# Patient Record
Sex: Female | Born: 1960 | Race: White | Hispanic: No | Marital: Married | Smoking: Never smoker
Health system: Southern US, Community
[De-identification: ages and names within clinical notes are randomized; demographics above are authoritative.]

## PROBLEM LIST (undated history)

## (undated) DIAGNOSIS — I10 Essential (primary) hypertension: Secondary | ICD-10-CM

## (undated) DIAGNOSIS — F32A Depression, unspecified: Secondary | ICD-10-CM

## (undated) DIAGNOSIS — I639 Cerebral infarction, unspecified: Secondary | ICD-10-CM

## (undated) DIAGNOSIS — M1990 Unspecified osteoarthritis, unspecified site: Secondary | ICD-10-CM

## (undated) DIAGNOSIS — Z87898 Personal history of other specified conditions: Secondary | ICD-10-CM

## (undated) DIAGNOSIS — F419 Anxiety disorder, unspecified: Secondary | ICD-10-CM

## (undated) DIAGNOSIS — E785 Hyperlipidemia, unspecified: Secondary | ICD-10-CM

## (undated) DIAGNOSIS — C801 Malignant (primary) neoplasm, unspecified: Secondary | ICD-10-CM

## (undated) DIAGNOSIS — R531 Weakness: Secondary | ICD-10-CM

## (undated) HISTORY — DX: Hyperlipidemia, unspecified: E78.5

## (undated) HISTORY — DX: Personal history of other specified conditions: Z87.898

## (undated) HISTORY — DX: Depression, unspecified: F32.A

## (undated) HISTORY — DX: Essential (primary) hypertension: I10

## (undated) HISTORY — DX: Cerebral infarction, unspecified: I63.9

## (undated) HISTORY — DX: Anxiety disorder, unspecified: F41.9

## (undated) HISTORY — PX: NO PAST SURGERIES: SHX2092

---

## 2007-01-12 ENCOUNTER — Ambulatory Visit: Payer: Self-pay | Admitting: Cardiology

## 2007-01-15 ENCOUNTER — Inpatient Hospital Stay (HOSPITAL_COMMUNITY)
Admission: RE | Admit: 2007-01-15 | Discharge: 2007-02-05 | Payer: Self-pay | Admitting: Physical Medicine & Rehabilitation

## 2007-01-15 ENCOUNTER — Ambulatory Visit: Payer: Self-pay | Admitting: Physical Medicine & Rehabilitation

## 2010-05-21 ENCOUNTER — Encounter: Payer: Self-pay | Admitting: Emergency Medicine

## 2010-06-04 ENCOUNTER — Ambulatory Visit (HOSPITAL_COMMUNITY): Admission: RE | Admit: 2010-06-04 | Discharge: 2010-06-04 | Payer: Self-pay | Admitting: Urology

## 2010-06-25 ENCOUNTER — Ambulatory Visit (HOSPITAL_COMMUNITY): Admission: RE | Admit: 2010-06-25 | Discharge: 2010-06-25 | Payer: Self-pay | Admitting: Urology

## 2010-10-25 ENCOUNTER — Inpatient Hospital Stay (HOSPITAL_COMMUNITY): Admission: EM | Admit: 2010-10-25 | Discharge: 2010-05-22 | Payer: Self-pay | Admitting: Urology

## 2011-02-03 LAB — COMPREHENSIVE METABOLIC PANEL
ALT: 22 U/L (ref 0–35)
Albumin: 3.5 g/dL (ref 3.5–5.2)
BUN: 14 mg/dL (ref 6–23)
Calcium: 8.8 mg/dL (ref 8.4–10.5)
Glucose, Bld: 141 mg/dL — ABNORMAL HIGH (ref 70–99)
Potassium: 3.4 mEq/L — ABNORMAL LOW (ref 3.5–5.1)
Sodium: 133 mEq/L — ABNORMAL LOW (ref 135–145)
Total Protein: 7.7 g/dL (ref 6.0–8.3)

## 2011-02-03 LAB — CBC
Hemoglobin: 12.7 g/dL (ref 12.0–15.0)
MCV: 84 fL (ref 78.0–100.0)
Platelets: 286 10*3/uL (ref 150–400)

## 2011-02-03 LAB — URINALYSIS, ROUTINE W REFLEX MICROSCOPIC
Glucose, UA: NEGATIVE mg/dL
Ketones, ur: 15 mg/dL — AB
Nitrite: NEGATIVE
Specific Gravity, Urine: 1.021 (ref 1.005–1.030)
Urobilinogen, UA: 2 mg/dL — ABNORMAL HIGH (ref 0.0–1.0)

## 2011-02-03 LAB — URINE CULTURE: Colony Count: 35000

## 2011-02-03 LAB — DIFFERENTIAL
Lymphs Abs: 1.1 10*3/uL (ref 0.7–4.0)
Monocytes Absolute: 0.6 10*3/uL (ref 0.1–1.0)
Monocytes Relative: 4 % (ref 3–12)
Neutro Abs: 13.3 10*3/uL — ABNORMAL HIGH (ref 1.7–7.7)
Neutrophils Relative %: 89 % — ABNORMAL HIGH (ref 43–77)

## 2011-02-03 LAB — URINE MICROSCOPIC-ADD ON

## 2011-02-13 IMAGING — CR DG ABDOMEN 1V
2 series · 2 of 2 positions shown · non-contrast
Comparison: 06/04/2010

CLINICAL DATA: Left UPJ stone

ABDOMEN - 1 VIEW

[t abdomen supine (1 of 2)]
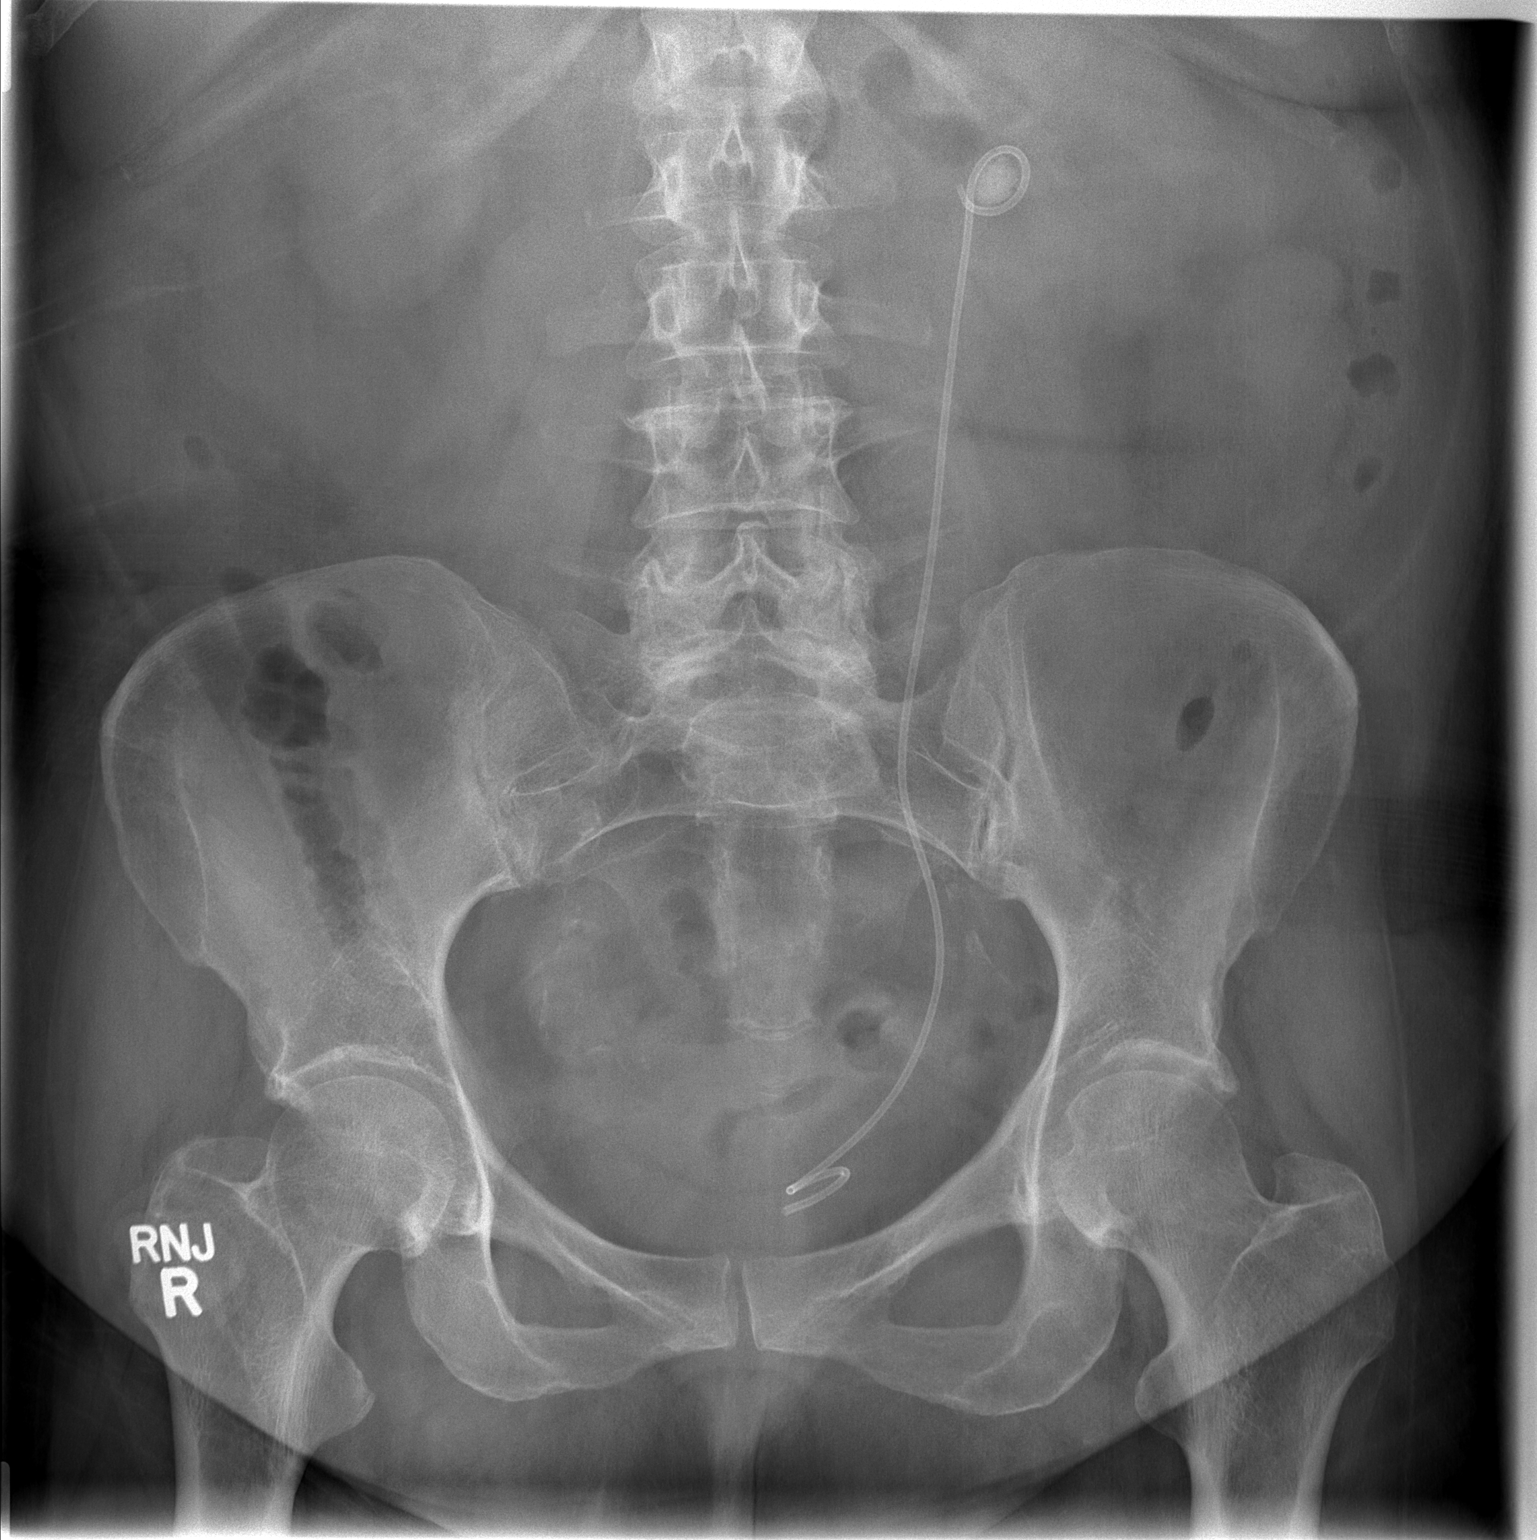

[t abdomen supine (2 of 2)]
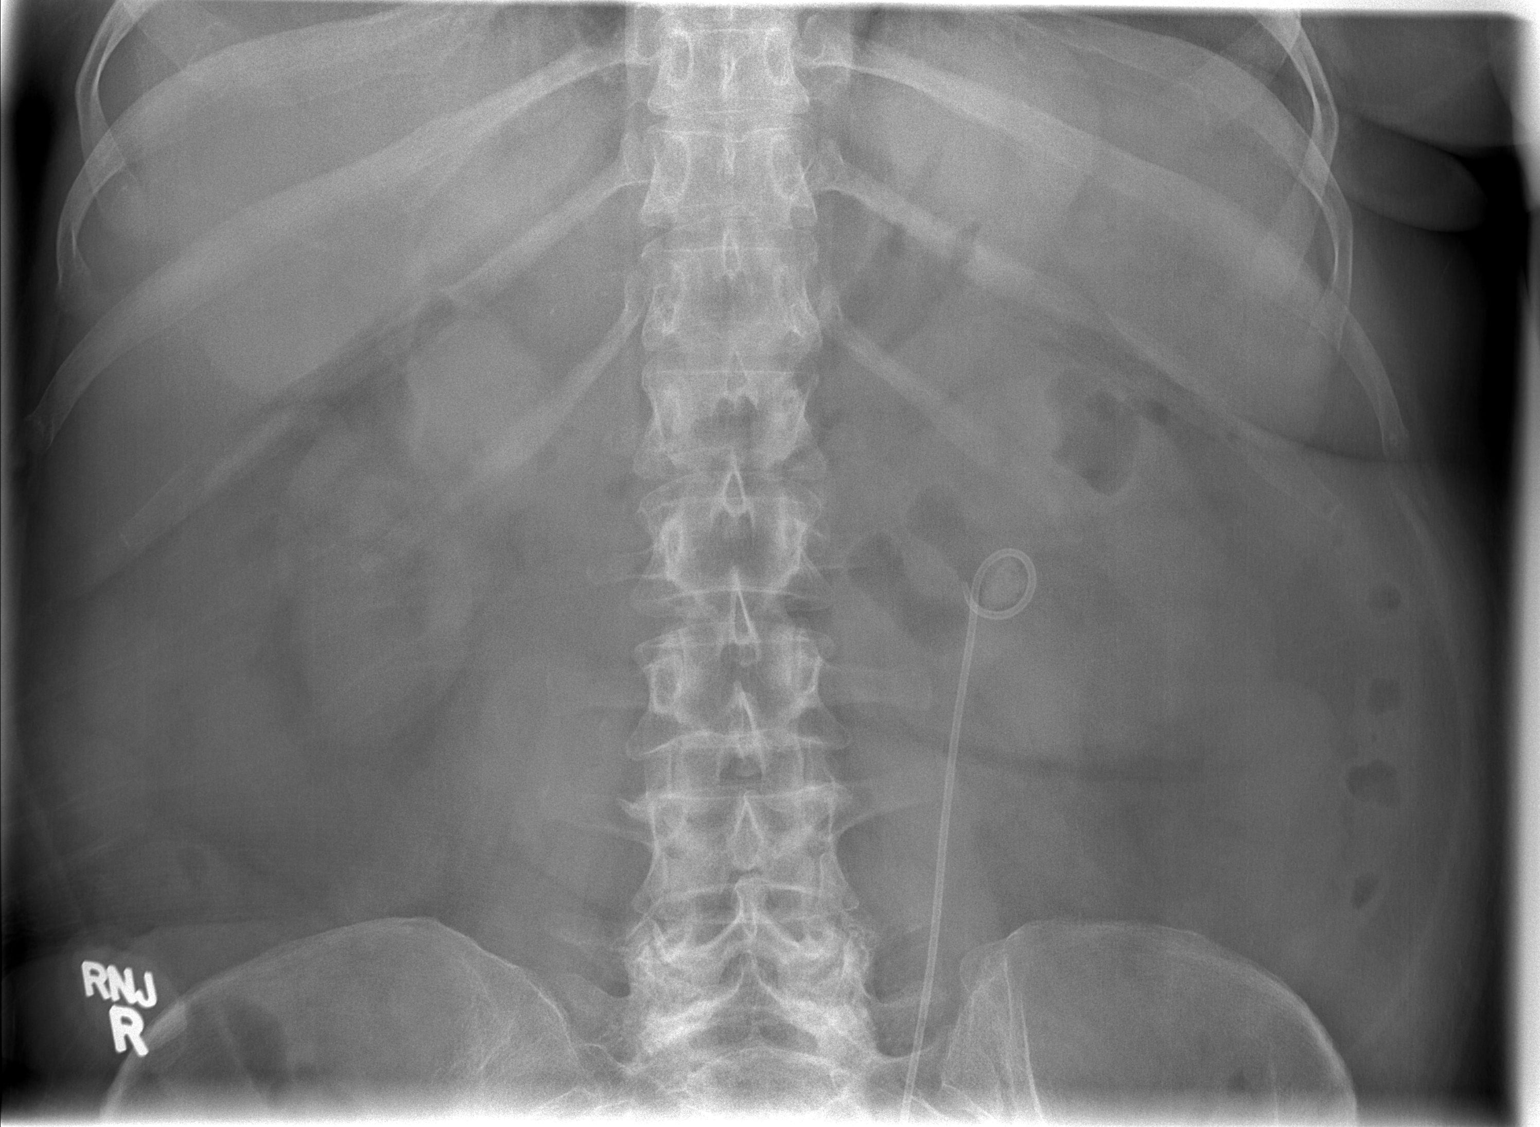

[2 of 2 positions shown; findings below may reference images not displayed]

FINDINGS: A left-sided double J ureteral stent remains in place.
There is a 1.3 cm left renal stone again noted.  No new stones are
seen.  The visualized portion the abdomen is otherwise
unremarkable.
IMPRESSION: 1.3 cm left UPJ stone with a stent remaining in place.

## 2011-04-01 ENCOUNTER — Ambulatory Visit (HOSPITAL_BASED_OUTPATIENT_CLINIC_OR_DEPARTMENT_OTHER)
Admission: RE | Admit: 2011-04-01 | Discharge: 2011-04-01 | Disposition: A | Discharge status: Home / Self Care | Payer: Medicare Other | Source: Ambulatory Visit | Attending: Urology | Admitting: Urology

## 2011-04-01 DIAGNOSIS — Z01812 Encounter for preprocedural laboratory examination: Secondary | ICD-10-CM | POA: Insufficient documentation

## 2011-04-01 DIAGNOSIS — R29898 Other symptoms and signs involving the musculoskeletal system: Secondary | ICD-10-CM | POA: Insufficient documentation

## 2011-04-01 DIAGNOSIS — I10 Essential (primary) hypertension: Secondary | ICD-10-CM | POA: Insufficient documentation

## 2011-04-01 DIAGNOSIS — I69998 Other sequelae following unspecified cerebrovascular disease: Secondary | ICD-10-CM | POA: Insufficient documentation

## 2011-04-01 DIAGNOSIS — N210 Calculus in bladder: Secondary | ICD-10-CM | POA: Insufficient documentation

## 2011-04-01 DIAGNOSIS — Z01810 Encounter for preprocedural cardiovascular examination: Secondary | ICD-10-CM | POA: Insufficient documentation

## 2011-04-01 DIAGNOSIS — Z466 Encounter for fitting and adjustment of urinary device: Secondary | ICD-10-CM | POA: Insufficient documentation

## 2011-04-01 LAB — POCT I-STAT 4, (NA,K, GLUC, HGB,HCT)
Glucose, Bld: 84 mg/dL (ref 70–99)
Hemoglobin: 13.6 g/dL (ref 12.0–15.0)
Potassium: 3.8 mEq/L (ref 3.5–5.1)

## 2011-04-02 NOTE — Op Note (Signed)
  Denise Harris, Denise Harris                ACCOUNT NO.:  [id number]  MEDICAL RECORD NO.:  [id number]          PATIENT TYPE:  LOCATION:                                 FACILITY:  PHYSICIAN:  Valetta Fuller, M.D.  DATE OF BIRTH:  February 05, 1961  DATE OF PROCEDURE: DATE OF DISCHARGE:                              OPERATIVE REPORT   PREOPERATIVE DIAGNOSES: 1. Retained double-J stent. 2. Large bladder calculus adherent to distal end of ureteral stent.  POSTOPERATIVE DIAGNOSES: 1. Retained double-J stent. 2. Large bladder calculus adherent to distal end of ureteral stent.  PROCEDURE PERFORMED:  Cystoscopy with electrohydraulic lithotripsy fragmentation of large bladder calculus and removal of double-J stent.  SURGEON:  Valetta Fuller, M.D.  ANESTHESIA:  General.  INDICATIONS:  [name] is 50 years of age.  The patient underwent treatment for a stone in the summer of 2011.  She had a 14-mm stone in her left renal pelvis.  She was treated with a double-J stent placement and then lithotripsy.  The patient had several followup appointments scheduled, which she did not show up for.  She had been recontacted and rescheduled on several occasions.  The patient subsequently was encouraged to get in.  A KUB showed the stent to be in position with a large calcification in the bladder end of her double-J stent. Cystoscopy confirmed a large adherent stone to the distal end of her double-J stent, but there were no obvious apparent calcifications on the more proximal aspect of the stent.  She presents now for EHL of the large bladder stone adherent to the stent along with an attempt at removal of her double-J stent if feasible.  TECHNIQUE AND FINDINGS:  The patient was brought to the operating room. She received perioperative gentamicin.  PAS compression boots were placed.  She had successful induction of general anesthesia and was then placed in the lithotomy position and prepped and draped in the  usual manner.  An appropriate surgical time-out was performed.  Cystoscopy revealed a large 3-cm to 4-cm stone which had formed around the distal aspect of her left double-J stent.  The bladder itself showed some diffuse erythema and inflammatory changes from the stent and stone. Utilizing saline as an irrigant, the EHL probe was utilized on the stone and we were able to knock all of the stone pieces off the double-J stent.  Larger pieces were then fractured with the EHL probe until they were all less than 5-mm to 6-mm in size.  Utilizing the grasper along with some fluoroscopic guidance, the double-J stent was able to be removed without any difficulty and no pressure or force was required to extract the stent.  Irrigation was utilized in the bladder to remove all the stone pieces.  The patient had no obvious complications or problems and was brought to the recovery room in stable condition.     Valetta Fuller, M.D.     DSG/MEDQ  D:  04/01/2011  T:  04/01/2011  Job:  [id number]  Electronically Signed by Barron Alvine M.D. on 04/02/2011 07:02:21 PM

## 2011-04-05 NOTE — H&P (Signed)
NAMECHARELLE, PETRAKIS NO.:  [id number]   MEDICAL RECORD NO.:  [id number]          PATIENT TYPE:  IPS   LOCATION:  4025                         FACILITY:  [facility]   PHYSICIAN:  Ellwood Dense, M.D.   DATE OF BIRTH:  Jul 02, 1961   DATE OF ADMISSION:  01/15/2007  DATE OF DISCHARGE:                              HISTORY & PHYSICAL   PRIMARY CARE PHYSICIAN:  None.   HISTORY OF PRESENT ILLNESS:  Ms. Wynder is a 50 year old adult female  with a fairly insignificant past medical history apart from pregnancy-  induced hypertension and gestational diabetes.  She had received no  medical attention recently.  She was admitted to [facility]  January 09, 2007 with progressive left-sided weakness through the day.  MRI study of the brain showed an acute right frontal infarct with  extensive chronic ischemic microvascular disease along with scattered  old white matter infarcts.  Carotid Doppler showed no internal carotid  artery stenosis.  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  Echocardiogram showed an ejection fraction of 50% with left ventricular  hypertrophy, mild pulmonary hypertension, and mild mitral regurgitation  and aortic insufficiency.  She was also noted to have elevated  cholesterol levels and was started on Lipitor.  She has been placed on  aspirin for stroke prophylaxis.  Coagulopathy studies are pending at  this time.   The patient was evaluated by the rehabilitation physicians and felt to  be an appropriate candidate for inpatient rehabilitation.   REVIEW OF SYSTEMS:  Positive for incontinence of bowel and bladder since  the above noted stroke.  Occasional diarrhea.  Abdominal pain.  Left  should pain.   PAST MEDICAL HISTORY:  1. Hypertension (during pregnancy).  2. History of gestational  diabetes.  3. Obesity.   FAMILY HISTORY:  Positive for coronary artery disease and breast cancer.   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  Her husband works during the day and  she has two children ages 2 and 7 who go to school.  The patient does  not use alcohol or tobacco.  She has received no medical attention for  several years.   FUNCTIONAL HISTORY PRIOR TO ADMISSION:  Independent and working in the  home.   ALLERGIES:  NO KNOWN DRUG ALLERGIES.   MEDICATIONS PRIOR TO ADMISSION:  None.   LABORATORIES:  Recent hemoglobin was 13.8, hematocrit of 39.6, and  platelet count of 322,000 with a white count of 10.3.  Recent sodium was  136, potassium 3.9, chloride 101, CO2 28, BUN 15 and creatinine 0.7 with  glucose of 106.  Recent cholesterol total was 204 with HDL cholesterol  of 26 and LDL cholesterol of 155 and triglycerides of 115.   PHYSICAL EXAMINATION:  GENERAL:  Reasonably well-appearing middle-aged  adult female lying in bed with obvious left-sided paresis.  Vitals  were  not yet obtained.  HEENT:  Normocephalic, nontraumatic.  CARDIOVASCULAR:  Regular rate and rhythm, S1 and S2 without murmurs.  ABDOMEN:  Soft, obese, nontender with positive bowel sounds.  LUNGS:  Clear to auscultation bilaterally.  EXTREMITIES:  Right upper and right lower extremity exam showed 5-/5  strength throughout.  Bulk and tone were normal and reflexes were 2+ and  symmetrical.  Sensation was intact to light touch throughout the right  upper and right lower extremity.  Left upper extremity exam showed 0-1/5  strength.  Sensation was decreased to light touch in the left upper  extremity.  Left lower extremity exam showed 0/5 strength with decreased  sensation.  Bulk was normal and tone was decreased in the left side.  Reflexes were 0 in the left upper and left lower extremity.   IMPRESSION:  1. 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  2. Hypertension, noncontrolled.  3. Dyslipidemia.   Presently the patient has deficits in ADLs, transfers and ambulation  secondary to the above noted right frontal infarct with left  hemiparesis.   PLAN:  1. Admit to the rehabilitation unit for daily physical therapy, for      range of motion, strengthening, bed mobility, transfers, pregait      training, gait training and equipment eval.  2. Occupational therapy for range of motion, strengthening, ADLs,      cognitive/perceptual training, splinting and equipment eval.  3. Speech therapy to assess higher level communication.  4. Case management to assess home environment, assist with discharge      planning and arrange for appropriate followup care with new primary      care physician.  5. Social worker to assess family and social support, counsel patient      regarding disability issues and assist in discharge planning      including setting up primary care.  6. Check admission labs including CBC and CMET Friday, January 16, 2007.  7. Monitor hypertension on Catapres 0.1 mg p.o. b.i.d., lisinopril 10      mg daily, Norvasc 10 mg daily and hydrochlorothiazide 12.5 mg p.o.      daily.  8. Check hemoglobin A1c level in the morning.  9. Continue enteric-coated aspirin 325 mg p.o. daily.  10.Continue Lipitor 10 mg p.o. q.p.m. for dyslipidemia.  11.Check postvoiding residuals and in-and-out cath for no void or      volumes greater than 300 mL.  12.__________ to the left lower extremity and splint left upper      extremity as necessary.  13.Lovenox subcutaneous 40 mg daily for DVT prophylaxis.  14.Keep the left upper extremity elevated on pillow.   PROGNOSIS:  Fair.   ESTIMATED LENGTH OF STAY:  15-25 days.   GOALS:  Standby assist to occasional min assist for ADLs, transfers and  short distance ambulation.           ______________________________  Ellwood Dense, M.D.    DC/MEDQ  D:   01/15/2007  T:  01/15/2007  Job:  [id number]

## 2011-04-05 NOTE — Discharge Summary (Signed)
[name], [name]                ACCOUNT NO.:  [id number]   MEDICAL RECORD NO.:  [id number]          PATIENT TYPE:  IPS   LOCATION:  4025                         FACILITY:  [facility]   PHYSICIAN:  Ranelle Oyster, M.D.DATE OF BIRTH:  Mar 30, 1961   DATE OF ADMISSION:  01/15/2007  DATE OF DISCHARGE:  02/05/2007                               DISCHARGE SUMMARY   DISCHARGE DIAGNOSIS:  1. Acute right frontal infarct.  2. Hypertension.  3. Urinary retention, resolved.  4. Left biceps tendonitis.   HISTORY OF PRESENT ILLNESS:  Denise Harris is a 50 year old female with a  history of pregnancy-induced hypertension, otherwise in good health,  admitted to [facility] on January 09, 2007, with progressive  left-sided weakness through the day.  MRI of brain showed acute right  frontal infarct, extensive chronic ischemic microvascular changes, and  scattered old white matter infarcts.  Carotid Dopplers done showed no  ICA stenosis.  MRA of brain showed moderate stenosis distal MI, left MCA  artery, and moderate stenosis of distal MI of the right MCA.  A cardiac  echo done showed EF of 50%, left ventricular hypertrophy, mild pulmonary  hypertension, mild MR and AI.  The patient also noted to have  dyslipidemia with cholesterol at 204, LDL 155, and HDL of 26, and  started on Lipitor.  Currently on aspirin for CVA prophylaxis.  Coagulopathy done, the report is unavailable.  Rehab consulted for  progressive therapy.   PAST MEDICAL HISTORY:  Significant for:  1. Hypertension during pregnancy.  2. History of gestational diabetes.  3. Obesity.   ALLERGIES:  NO KNOWN DRUG ALLERGIES.   FAMILY HISTORY:  Positive for coronary artery disease and breast cancer.   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  Does not use any tobacco or alcohol, has  2 children at home ages 41 and 28.   HOSPITAL COURSE:  Ms. Denise Harris was admitted to  rehab on January 15, 2007, for inpatient therapies to consist of PT, OT daily.  At time of  admission the patient was noted to have soft speech, able to follow two-  step commands, noted to have dense left hemiparesis with left upper  extremity being flaccid.  The patient reported problems with  incontinence of bladder, new since CVA.  The patient was maintained on  aspirin for CVA prophylaxis throughout her stay.  Bladder scanning was  done secondary to complaints of incontinence and the patient was noted  to have bladder volumes up to 400 mL.  She was started on in-and-out  cath schedule if no void in 8 hours or for high bladder volumes.  A UA/UC was sent off, and showed no growth.  Check of CBC revealed  hemoglobin 13.3, hematocrit 39.1, white count 10.4, platelets 311.  Check of lytes revealed sodium 137, potassium 4.2, chloride 102, CO2 26,  BUN 21, creatinine 0.81, glucose 92.  LFTs revealed AST at 21, ALT 19,  alkaline phos 90, albumin 3.5, total protein 6.9, total bili 0.9.  Hemoglobin A1c  was normal at 5.2.  The patient's blood pressures were monitored on b.i.d. basis and were  noted to be from the low 100s to 130s systolic, 60s to 16X diastolic.  Heart rate stable.  As the patient with problems voiding, she was started on Urecholine at  25 mg n.p.o. q.i.d.  As voiding improved, this was tapered off by time  of discharge.  The patient was noted to have a affect with some issues  with maintaining attention to task.  The patient denied depression.  She  was started on Reglan for activation, motivation and this was increased  to 15 mg b.i.d.  This will be tapered off in the next 4 weeks past  discharge.  The patient with complaints of left shoulder pain and  Neurontin was added to help with neuropathic symptoms.  She continued to  have an increasing left shoulder pain.  X-rays of left shoulder were  negative for fracture/dislocation.  Left shoulder was injected with  cortisone on  January 27, 2007, for biceps tendonitis and this has provided  improvement in her pain symptomatology.  The patient has also been  instructed regarding keeping left upper extremity elevated and working  on gentle range of motion as well as ice to help with symptomatology.  The patient continues with dense left sided hemiparesis, noted to have  approximately 1/2 inch subluxation at shoulder with trace shoulder  elevation, no active movement.  Left lower extremity shows __________  stage I with positive range of motion.  She does show improvement in  posture with some extension out of lateral flexion of her spine.  Speech  therapy has been working with the patient for cognitive issues, basic  and high level expression are intact.  She requires min assist to slow  down her speech rate and for vocal intensity.  Reading comprehension and  writing expression are intact.  The patient does continue to require  supervision in a mildly  distractive environment.  She is able to  redirect self.  She continues to show increased awareness of her  deficits.  Basic recall of day-to-day issues intact.  Safety concerns  regarding physical limitations continue to be an issue with the patient,  requiring constant reminders regarding in terms of personal precautions  and for the safety rate in terms of transferring and moving.  She also  continues to require mod assist for complex problem solving and  reasoning activities.  The patient is currently at max assist for self-  care with OT focusing on weight shifting, standing tolerance, and  stabilization of left upper extremity.  The patient is at a max assist  for transfers and ambulation.  A left AFO was ordered to help with gait.  The patient will continue to receive further followup by the home health  PT, OT by [facility], [facility] aid has been scheduled by Nursing.  On February 05, 2007, the patient is discharged to home.   DISCHARGE MEDICATIONS:  1.  Catapres 0.1 mg b.i.d.  2. Prinivil 10 mg a day  3. Lipitor 10 mg q.h.s.  4. Coated aspirin 325 mg a day.  5. Norvasc 10 mg a day.  6. HCTZ 12.5 mg a day.  7. Neurontin 100 mg t.i.d.  8. Ritalin 10 mg b.i.d. x2 weeks, then one q.a.m. for 2 weeks till      gone.   DIET:  Low-fat.   ACTIVITY:  24-hour supervision assistance.  No alcohol, no smoking, no  driving  FOLLOWUP:  1. The patient to follow up with LMD for routine check in 2 weeks.  d2.  Follow up with Dr. Riley Kill on [id number].      Greg Cutter, P.A.      Ranelle Oyster, M.D.  Electronically Signed    PP/MEDQ  D:  02/05/2007  T:  02/05/2007  Job:  [id number]   cc:   Tyna Jaksch, M.D.

## 2018-04-07 DIAGNOSIS — E559 Vitamin D deficiency, unspecified: Secondary | ICD-10-CM | POA: Insufficient documentation

## 2018-04-07 DIAGNOSIS — N3281 Overactive bladder: Secondary | ICD-10-CM | POA: Insufficient documentation

## 2021-04-14 LAB — COLOGUARD: COLOGUARD: NEGATIVE

## 2021-04-14 LAB — EXTERNAL GENERIC LAB PROCEDURE: COLOGUARD: NEGATIVE

## 2022-06-13 ENCOUNTER — Telehealth: Payer: Self-pay | Admitting: *Deleted

## 2022-06-13 NOTE — Telephone Encounter (Signed)
Spoke with the patient's daughter regarding the referral to GYN oncology. Patient scheduled as new patient with Dr Berline Lopes on 8/10 at 9:45 am. Patient given an arrival time of 9:15 am.  Explained to the patient the the doctor will perform a pelvic exam at this visit. Patient given the policy that no visitors under the 16 yrs are a loud in the [facility]. Patient given the address/phone number for the clinic and that the center offers free valet service.

## 2022-06-26 ENCOUNTER — Encounter: Payer: Self-pay | Admitting: Gynecologic Oncology

## 2022-06-27 ENCOUNTER — Inpatient Hospital Stay (HOSPITAL_BASED_OUTPATIENT_CLINIC_OR_DEPARTMENT_OTHER): Payer: Medicare Other | Admitting: Gynecologic Oncology

## 2022-06-27 ENCOUNTER — Inpatient Hospital Stay: Payer: Medicare Other | Attending: Gynecologic Oncology | Admitting: Gynecologic Oncology

## 2022-06-27 ENCOUNTER — Other Ambulatory Visit: Payer: Self-pay

## 2022-06-27 ENCOUNTER — Encounter: Payer: Self-pay | Admitting: Gynecologic Oncology

## 2022-06-27 VITALS — BP 131/71 | HR 73 | Temp 97.8°F | Resp 16 | Ht 65.75 in | Wt 164.0 lb

## 2022-06-27 DIAGNOSIS — C541 Malignant neoplasm of endometrium: Secondary | ICD-10-CM | POA: Diagnosis present

## 2022-06-27 DIAGNOSIS — F419 Anxiety disorder, unspecified: Secondary | ICD-10-CM | POA: Diagnosis not present

## 2022-06-27 DIAGNOSIS — F32A Depression, unspecified: Secondary | ICD-10-CM | POA: Insufficient documentation

## 2022-06-27 DIAGNOSIS — Z79899 Other long term (current) drug therapy: Secondary | ICD-10-CM | POA: Insufficient documentation

## 2022-06-27 DIAGNOSIS — K5900 Constipation, unspecified: Secondary | ICD-10-CM | POA: Diagnosis not present

## 2022-06-27 DIAGNOSIS — R32 Unspecified urinary incontinence: Secondary | ICD-10-CM | POA: Insufficient documentation

## 2022-06-27 DIAGNOSIS — Z8673 Personal history of transient ischemic attack (TIA), and cerebral infarction without residual deficits: Secondary | ICD-10-CM | POA: Diagnosis not present

## 2022-06-27 DIAGNOSIS — E785 Hyperlipidemia, unspecified: Secondary | ICD-10-CM | POA: Diagnosis not present

## 2022-06-27 DIAGNOSIS — I639 Cerebral infarction, unspecified: Secondary | ICD-10-CM | POA: Insufficient documentation

## 2022-06-27 DIAGNOSIS — R413 Other amnesia: Secondary | ICD-10-CM

## 2022-06-27 DIAGNOSIS — I10 Essential (primary) hypertension: Secondary | ICD-10-CM | POA: Diagnosis not present

## 2022-06-27 MED ORDER — TRAMADOL HCL 50 MG PO TABS: 50.0000 mg | ORAL_TABLET | Freq: Four times a day (QID) | ORAL | 0 refills | Status: DC | PRN

## 2022-06-27 MED ORDER — SENNOSIDES-DOCUSATE SODIUM 8.6-50 MG PO TABS: 2.0000 | ORAL_TABLET | Freq: Every day | ORAL | 0 refills | Status: DC

## 2022-06-27 NOTE — Patient Instructions (Addendum)
Preparing for your Surgery  Plan for surgery on July 23, 2022 with Dr. Jeral Pinch at [place] will be scheduled for robotic assisted total laparoscopic hysterectomy (removal of the uterus and cervix), bilateral salpingo-oophorectomy (removal of both ovaries and fallopian tubes), sentinel lymph node biopsy, possible lymph node dissection, possible laparotomy (larger incision on your abdomen if needed).   Pre-operative Testing -You will receive a phone call from presurgical testing at [facility] to arrange for a pre-operative appointment and lab work.  -Bring your insurance card, copy of an advanced directive if applicable, medication list  -At that visit, you will be asked to sign a consent for a possible blood transfusion in case a transfusion becomes necessary during surgery.  The need for a blood transfusion is rare but having consent is a necessary part of your care.     -You should not be taking blood thinners or aspirin at least ten days prior to surgery unless instructed by your surgeon.  -Do not take supplements such as fish oil (omega 3), red yeast rice, turmeric before your surgery. You want to avoid medications with aspirin in them including headache powders such as BC or Goody's), Excedrin migraine.  Day Before Surgery at [place] will be asked to take in a light diet the day before surgery. You will be advised you can have clear liquids up until 3 hours before your surgery.    Eat a light diet the day before surgery.  Examples including soups, broths, toast, yogurt, mashed potatoes.  AVOID GAS PRODUCING FOODS. Things to avoid include carbonated beverages (fizzy beverages, sodas), raw fruits and raw vegetables (uncooked), or beans.   If your bowels are filled with gas, your surgeon will have difficulty visualizing your pelvic organs which increases your surgical risks.  Your role in recovery Your role is to become active as soon as directed by  your doctor, while still giving yourself time to heal.  Rest when you feel tired. You will be asked to do the following in order to speed your recovery:  - Cough and breathe deeply. This helps to clear and expand your lungs and can prevent pneumonia after surgery.  - [place]. Do mild physical activity. Walking or moving your legs help your circulation and body functions return to normal. Do not try to get up or walk alone the first time after surgery.   -If you develop swelling on one leg or the other, pain in the back of your leg, redness/warmth in one of your legs, please call the office or go to the Emergency Room to have a doppler to rule out a blood clot. For shortness of breath, chest pain-seek care in the Emergency Room as soon as possible. - Actively manage your pain. Managing your pain lets you move in comfort. We will ask you to rate your pain on a scale of zero to 10. It is your responsibility to tell your doctor or nurse where and how much you hurt so your pain can be treated.  Special Considerations -If you are diabetic, you may be placed on insulin after surgery to have closer control over your blood sugars to promote healing and recovery.  This does not mean that you will be discharged on insulin.  If applicable, your oral antidiabetics will be resumed when you are tolerating a solid diet.  -Your final pathology results from surgery should be available around one week after surgery and the results will  be relayed to you when available.  -Dr. Lahoma Crocker is the surgeon that assists your GYN Oncologist with surgery.  If you end up staying the night, the next day after your surgery you will either see Dr. Berline Lopes or Dr. Lahoma Crocker.  -FMLA forms can be faxed to [phone] and please allow 5-7 business days for completion.  Pain Management After Surgery -You have been prescribed your pain medication and bowel regimen medications before surgery so that  you can have these available when you are discharged from the hospital. The pain medication is for use ONLY AFTER surgery and a new prescription will not be given.   -Make sure that you have Tylenol and Ibuprofen (OR CONTINUE WITH VOLTAREN, DO NOT TAKE TOGETHER) IF YOU ARE ABLE TO TAKE THESE MEDICATIONS at home to use on a regular basis after surgery for pain control. We recommend alternating the medications every hour to six hours since they work differently and are processed in the body differently for pain relief.  -Review the attached handout on narcotic use and their risks and side effects.   Bowel Regimen -You have been prescribed Sennakot-S to take nightly to prevent constipation especially if you are taking the narcotic pain medication intermittently.  It is important to prevent constipation and drink adequate amounts of liquids. You can stop taking this medication when you are not taking pain medication and you are back on your normal bowel routine.  Risks of Surgery Risks of surgery are low but include bleeding, infection, damage to surrounding structures, re-operation, blood clots, and very rarely death.   Blood Transfusion Information (For the consent to be signed before surgery)  We will be checking your blood type before surgery so in case of emergencies, we will know what type of blood you would need.                                            WHAT IS A BLOOD TRANSFUSION?  A transfusion is the replacement of blood or some of its parts. Blood is made up of multiple cells which provide different functions. Red blood cells carry oxygen and are used for blood loss replacement. White blood cells fight against infection. Platelets control bleeding. Plasma helps clot blood. Other blood products are available for specialized needs, such as hemophilia or other clotting disorders. BEFORE THE TRANSFUSION  Who gives blood for transfusions?  You may be able to donate blood to be used at  a later date on yourself (autologous donation). Relatives can be asked to donate blood. This is generally not any safer than if you have received blood from a stranger. The same precautions are taken to ensure safety when a relative's blood is donated. Healthy volunteers who are fully evaluated to make sure their blood is safe. This is blood bank blood. Transfusion therapy is the safest it has ever been in the practice of medicine. Before blood is taken from a donor, a complete history is taken to make sure that person has no history of diseases nor engages in risky social behavior (examples are intravenous drug use or sexual activity with multiple partners). The donor's travel history is screened to minimize risk of transmitting infections, such as malaria. The donated blood is tested for signs of infectious diseases, such as HIV and hepatitis. The blood is then tested to be sure it is compatible with you  in order to minimize the chance of a transfusion reaction. If you or a relative donates blood, this is often done in anticipation of surgery and is not appropriate for emergency situations. It takes many days to process the donated blood. RISKS AND COMPLICATIONS Although transfusion therapy is very safe and saves many lives, the main dangers of transfusion include:  Getting an infectious disease. Developing a transfusion reaction. This is an allergic reaction to something in the blood you were given. Every precaution is taken to prevent this. The decision to have a blood transfusion has been considered carefully by your caregiver before blood is given. Blood is not given unless the benefits outweigh the risks.  AFTER SURGERY INSTRUCTIONS  Return to work: 4-6 weeks if applicable  Activity: 1. Be up and out of the bed during the day.  Take a nap if needed.  You may walk up steps but be careful and use the hand rail.  Stair climbing will tire you more than you think, you may need to stop part way and  rest.   2. No lifting or straining for 6 weeks over 10 pounds. No pushing, pulling, straining for 6 weeks.  3. No driving for AROUND 1 week(s) if you were cleared before surgery.  Do not drive if you are taking narcotic pain medicine and make sure that your reaction time has returned.   4. You can shower as soon as the next day after surgery. Shower daily.  Use your regular soap and water (not directly on the incision) and pat your incision(s) dry afterwards; don't rub.  No tub baths or submerging your body in water until cleared by your surgeon. If you have the soap that was given to you by pre-surgical testing that was used before surgery, you do not need to use it afterwards because this can irritate your incisions.   5. No sexual activity and nothing in the vagina for 8 weeks.  6. You may experience a small amount of clear drainage from your incisions, which is normal.  If the drainage persists, increases, or changes color please call the office.  7. Do not use creams, lotions, or ointments such as neosporin on your incisions after surgery until advised by your surgeon because they can cause removal of the dermabond glue on your incisions.    8. You may experience vaginal spotting after surgery or around the 6-8 week mark from surgery when the stitches at the top of the vagina begin to dissolve.  The spotting is normal but if you experience heavy bleeding, call our office.  9. Take Tylenol or ibuprofen (or continue with voltaren, do not take together) first for pain if you are able to take these medications and only use narcotic pain medication for severe pain not relieved by the Tylenol or Ibuprofen (or voltaren).  Monitor your Tylenol intake to a max of 4,000 mg in a 24 hour period. You can alternate these medications after surgery.  Diet: 1. Low sodium Heart Healthy Diet is recommended but you are cleared to resume your normal (before surgery) diet after your procedure.  2. It is safe to  use a laxative, such as Miralax or Colace, if you have difficulty moving your bowels. You have been prescribed Sennakot-S to take at bedtime every evening after surgery to keep bowel movements regular and to prevent constipation.    Wound Care: 1. Keep clean and dry.  Shower daily.  Reasons to call the Doctor: Fever - Oral temperature greater  than 100.4 degrees Fahrenheit Foul-smelling vaginal discharge Difficulty urinating Nausea and vomiting Increased pain at the site of the incision that is unrelieved with pain medicine. Difficulty breathing with or without chest pain New calf pain especially if only on one side Sudden, continuing increased vaginal bleeding with or without clots.   Contacts: For questions or concerns you should contact:  Dr. Jeral Pinch at [phone]  Joylene John, NP at [phone]  After Hours: call [phone] and have the GYN Oncologist paged/contacted (after 5 pm or on the weekends).  Messages sent via mychart are for non-urgent matters and are not responded to after hours so for urgent needs, please call the after hours number.

## 2022-06-27 NOTE — Patient Instructions (Signed)
Preparing for your Surgery   Plan for surgery on July 23, 2022 with Dr. Jeral Pinch at [place] will be scheduled for robotic assisted total laparoscopic hysterectomy (removal of the uterus and cervix), bilateral salpingo-oophorectomy (removal of both ovaries and fallopian tubes), sentinel lymph node biopsy, possible lymph node dissection, possible laparotomy (larger incision on your abdomen if needed).    Pre-operative Testing -You will receive a phone call from presurgical testing at [facility] to arrange for a pre-operative appointment and lab work.   -Bring your insurance card, copy of an advanced directive if applicable, medication list   -At that visit, you will be asked to sign a consent for a possible blood transfusion in case a transfusion becomes necessary during surgery.  The need for a blood transfusion is rare but having consent is a necessary part of your care.      -You should not be taking blood thinners or aspirin at least ten days prior to surgery unless instructed by your surgeon.   -Do not take supplements such as fish oil (omega 3), red yeast rice, turmeric before your surgery. You want to avoid medications with aspirin in them including headache powders such as BC or Goody's), Excedrin migraine.   Day Before Surgery at [place] will be asked to take in a light diet the day before surgery. You will be advised you can have clear liquids up until 3 hours before your surgery.     Eat a light diet the day before surgery.  Examples including soups, broths, toast, yogurt, mashed potatoes.  AVOID GAS PRODUCING FOODS. Things to avoid include carbonated beverages (fizzy beverages, sodas), raw fruits and raw vegetables (uncooked), or beans.    If your bowels are filled with gas, your surgeon will have difficulty visualizing your pelvic organs which increases your surgical risks.   Your role in recovery Your role is to become active as soon as  directed by your doctor, while still giving yourself time to heal.  Rest when you feel tired. You will be asked to do the following in order to speed your recovery:   - Cough and breathe deeply. This helps to clear and expand your lungs and can prevent pneumonia after surgery.  - [place]. Do mild physical activity. Walking or moving your legs help your circulation and body functions return to normal. Do not try to get up or walk alone the first time after surgery.   -If you develop swelling on one leg or the other, pain in the back of your leg, redness/warmth in one of your legs, please call the office or go to the Emergency Room to have a doppler to rule out a blood clot. For shortness of breath, chest pain-seek care in the Emergency Room as soon as possible. - Actively manage your pain. Managing your pain lets you move in comfort. We will ask you to rate your pain on a scale of zero to 10. It is your responsibility to tell your doctor or nurse where and how much you hurt so your pain can be treated.   Special Considerations -If you are diabetic, you may be placed on insulin after surgery to have closer control over your blood sugars to promote healing and recovery.  This does not mean that you will be discharged on insulin.  If applicable, your oral antidiabetics will be resumed when you are tolerating a solid diet.   -Your final pathology results from  surgery should be available around one week after surgery and the results will be relayed to you when available.   -Dr. Lahoma Crocker is the surgeon that assists your GYN Oncologist with surgery.  If you end up staying the night, the next day after your surgery you will either see Dr. Berline Lopes or Dr. Lahoma Crocker.   -FMLA forms can be faxed to [phone] and please allow 5-7 business days for completion.   Pain Management After Surgery -You have been prescribed your pain medication and bowel regimen medications  before surgery so that you can have these available when you are discharged from the hospital. The pain medication is for use ONLY AFTER surgery and a new prescription will not be given.    -Make sure that you have Tylenol and Ibuprofen (OR CONTINUE WITH VOLTAREN, DO NOT TAKE TOGETHER) IF YOU ARE ABLE TO TAKE THESE MEDICATIONS at home to use on a regular basis after surgery for pain control. We recommend alternating the medications every hour to six hours since they work differently and are processed in the body differently for pain relief.   -Review the attached handout on narcotic use and their risks and side effects.    Bowel Regimen -You have been prescribed Sennakot-S to take nightly to prevent constipation especially if you are taking the narcotic pain medication intermittently.  It is important to prevent constipation and drink adequate amounts of liquids. You can stop taking this medication when you are not taking pain medication and you are back on your normal bowel routine.   Risks of Surgery Risks of surgery are low but include bleeding, infection, damage to surrounding structures, re-operation, blood clots, and very rarely death.     Blood Transfusion Information (For the consent to be signed before surgery)   We will be checking your blood type before surgery so in case of emergencies, we will know what type of blood you would need.                                             WHAT IS A BLOOD TRANSFUSION?   A transfusion is the replacement of blood or some of its parts. Blood is made up of multiple cells which provide different functions. Red blood cells carry oxygen and are used for blood loss replacement. White blood cells fight against infection. Platelets control bleeding. Plasma helps clot blood. Other blood products are available for specialized needs, such as hemophilia or other clotting disorders. BEFORE THE TRANSFUSION  Who gives blood for transfusions?  You may be  able to donate blood to be used at a later date on yourself (autologous donation). Relatives can be asked to donate blood. This is generally not any safer than if you have received blood from a stranger. The same precautions are taken to ensure safety when a relative's blood is donated. Healthy volunteers who are fully evaluated to make sure their blood is safe. This is blood bank blood. Transfusion therapy is the safest it has ever been in the practice of medicine. Before blood is taken from a donor, a complete history is taken to make sure that person has no history of diseases nor engages in risky social behavior (examples are intravenous drug use or sexual activity with multiple partners). The donor's travel history is screened to minimize risk of transmitting infections, such as malaria. The donated blood  is tested for signs of infectious diseases, such as HIV and hepatitis. The blood is then tested to be sure it is compatible with you in order to minimize the chance of a transfusion reaction. If you or a relative donates blood, this is often done in anticipation of surgery and is not appropriate for emergency situations. It takes many days to process the donated blood. RISKS AND COMPLICATIONS Although transfusion therapy is very safe and saves many lives, the main dangers of transfusion include:  Getting an infectious disease. Developing a transfusion reaction. This is an allergic reaction to something in the blood you were given. Every precaution is taken to prevent this. The decision to have a blood transfusion has been considered carefully by your caregiver before blood is given. Blood is not given unless the benefits outweigh the risks.   AFTER SURGERY INSTRUCTIONS   Return to work: 4-6 weeks if applicable   Activity: 1. Be up and out of the bed during the day.  Take a nap if needed.  You may walk up steps but be careful and use the hand rail.  Stair climbing will tire you more than you  think, you may need to stop part way and rest.    2. No lifting or straining for 6 weeks over 10 pounds. No pushing, pulling, straining for 6 weeks.   3. No driving for AROUND 1 week(s) if you were cleared before surgery.  Do not drive if you are taking narcotic pain medicine and make sure that your reaction time has returned.    4. You can shower as soon as the next day after surgery. Shower daily.  Use your regular soap and water (not directly on the incision) and pat your incision(s) dry afterwards; don't rub.  No tub baths or submerging your body in water until cleared by your surgeon. If you have the soap that was given to you by pre-surgical testing that was used before surgery, you do not need to use it afterwards because this can irritate your incisions.    5. No sexual activity and nothing in the vagina for 8 weeks.   6. You may experience a small amount of clear drainage from your incisions, which is normal.  If the drainage persists, increases, or changes color please call the office.   7. Do not use creams, lotions, or ointments such as neosporin on your incisions after surgery until advised by your surgeon because they can cause removal of the dermabond glue on your incisions.     8. You may experience vaginal spotting after surgery or around the 6-8 week mark from surgery when the stitches at the top of the vagina begin to dissolve.  The spotting is normal but if you experience heavy bleeding, call our office.   9. Take Tylenol or ibuprofen (or continue with voltaren, do not take together) first for pain if you are able to take these medications and only use narcotic pain medication for severe pain not relieved by the Tylenol or Ibuprofen (or voltaren).  Monitor your Tylenol intake to a max of 4,000 mg in a 24 hour period. You can alternate these medications after surgery.   Diet: 1. Low sodium Heart Healthy Diet is recommended but you are cleared to resume your normal (before  surgery) diet after your procedure.   2. It is safe to use a laxative, such as Miralax or Colace, if you have difficulty moving your bowels. You have been prescribed Sennakot-S to take at  bedtime every evening after surgery to keep bowel movements regular and to prevent constipation.     Wound Care: 1. Keep clean and dry.  Shower daily.   Reasons to call the Doctor: Fever - Oral temperature greater than 100.4 degrees Fahrenheit Foul-smelling vaginal discharge Difficulty urinating Nausea and vomiting Increased pain at the site of the incision that is unrelieved with pain medicine. Difficulty breathing with or without chest pain New calf pain especially if only on one side Sudden, continuing increased vaginal bleeding with or without clots.   Contacts: For questions or concerns you should contact:   Dr. Jeral Pinch at [phone]   Joylene John, NP at [phone]   After Hours: call [phone] and have the GYN Oncologist paged/contacted (after 5 pm or on the weekends).   Messages sent via mychart are for non-urgent matters and are not responded to after hours so for urgent needs, please call the after hours number.

## 2022-06-27 NOTE — Progress Notes (Signed)
GYNECOLOGIC ONCOLOGY NEW PATIENT CONSULTATION   Patient Name: Denise Harris  Patient Age: 61 y.o. Date of Service: 06/27/22 Referring Provider: Billee Cashing MD  Primary Care Provider: No primary care provider on file. 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  We reviewed the nature of endometrial cancer and its recommended surgical staging, including total hysterectomy, bilateral salpingo-oophorectomy, and lymph node assessment. The patient is a suitable candidate for staging via a minimally invasive approach to surgery.  We reviewed that robotic assistance would be used to complete the surgery.   We discussed that most endometrial cancer is detected early and that decisions regarding adjuvant therapy will be made based on her final pathology.   We reviewed the sentinel lymph node technique. Risks and benefits of sentinel lymph node biopsy was reviewed. We reviewed the technique and ICG dye. The patient DOES NOT have an iodine allergy or known liver dysfunction. We reviewed the false negative rate (0.4%), and that 3% of patients with metastatic disease will not have it detected by SLN biopsy in endometrial cancer. A low risk of allergic reaction to the dye, <0.2% for ICG, has been reported. We also discussed that in the case of failed mapping, which occurs 40% of the time, a bilateral or unilateral lymphadenectomy will be performed at the surgeon's discretion.   Potential benefits of sentinel nodes including a higher detection rate for metastasis due to ultrastaging and potential reduction in operative morbidity. However, there remains uncertainty as to the role for treatment of micrometastatic disease. Further, the benefit of operative morbidity associated with the SLN technique in endometrial cancer is not yet completely known. In other patient populations (e.g.  the cervical cancer population) there has been observed reductions in morbidity with SLN biopsy compared to pelvic lymphadenectomy. Lymphedema, nerve dysfunction and lymphocysts are all potential risks with the SLN technique as with complete lymphadenectomy. Additional risks to the patient include the risk of damage to an internal organ while operating in an altered view (e.g. the black and white image of the robotic fluorescence imaging mode).   We discussed the plan for a robotic assisted hysterectomy, bilateral salpingo-oophorectomy, sentinel lymph node evaluation, possible lymph node dissection, possible laparotomy. The risks of surgery were discussed in detail and she understands these to include infection; wound separation; hernia; vaginal cuff separation, injury to adjacent organs such as bowel, bladder, blood vessels, ureters and nerves; bleeding which may require blood transfusion; anesthesia risk; thromboembolic events; possible death; unforeseen complications; possible need for re-exploration; medical complications such as heart attack, stroke, pleural effusion and pneumonia; and, if full lymphadenectomy is performed the risk of lymphedema and lymphocyst. The patient will receive DVT and antibiotic prophylaxis as indicated. She voiced a clear understanding. She had the opportunity to ask questions. Perioperative instructions were reviewed with her. Prescriptions for post-op medications were sent to her pharmacy of choice.  Given her history of stroke, will reach out to her primary care provider for clearance.  Will also assure that she is a candidate to receive preoperative heparin.  Discussed goals of surgery to keep time under anesthesia limited.  Will also plan for same-day discharge given her memory deficits.  A copy of this note was sent to the patient's referring provider.   65 minutes of total time was spent for this patient encounter, including preparation, face-to-face counseling with the  patient and coordination of care, and documentation of the encounter.   Denise Harris  Berline Lopes, MD  Division of Gynecologic Oncology  Department of Obstetrics and Gynecology  [facility]  ___________________________________________  Chief Complaint: Chief Complaint  Patient presents with   Endometrial cancer [facility])    History of Present Illness:  Denise Harris is a 61 y.o. y.o. female who is seen in consultation at the request of Dr. Addison Harris for an evaluation of endometrial cancer.  Patient presented with several years of intermittent postmenopausal spotting.  It appears that she had a Pap test with her primary care provider although I cannot see the results of this in care everywhere.  She was then referred to OB/GYN.  Pap test on 05/02/2022 showed atypical glandular cells suspicious for adenocarcinoma of the endometrium.  High risk HPV negative.  On 06/05/2022, patient underwent endocervical and endometrial sampling as well as colposcopy.  Colposcopy was satisfactory with no abnormalities of the cervix visualized.  Endometrial biopsy showed atypical glandular proliferation consistent with grade 1 endometrioid adenocarcinoma with villoglandular architecture.  Endocervical biopsy showed collections of benign squamous epithelial cells, no dysplasia or malignancy.  Today, the patient presents with her stepdaughter, Denise Harris.  She denies any vaginal bleeding except for small amount of spotting after her recent biopsy.  She denies any pelvic pain or cramping.  Patient endorses a good appetite without nausea or emesis.  Denies any recent weight changes.  She has baseline constipation, bowel movement every couple of days.  Denies taking any medication.  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  Primary care provider manages this.  She was previously seen a urologist.  Has a history of a stroke related to hypertension in 2008.  Has  significant left-sided deficits.  Walks with a cane.  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  Lives with her daughter-in-law and her daughter-in-law's husband as well as the patient's oldest daughter.  PAST MEDICAL HISTORY:  Past Medical History:  Diagnosis Date   Anxiety    Depression    History of short term memory loss    Due to stroke   HLD (hyperlipidemia)    Hypertension    Stroke ([place])    2008, HTN     PAST SURGICAL HISTORY:  History reviewed. No pertinent surgical history.  OB/GYN HISTORY:  OB History  Gravida Para Term Preterm AB Living  2 2          SAB IAB Ectopic Multiple Live Births               # Outcome Date GA Lbr Len/2nd Weight Sex Delivery Anes PTL Lv  2 Para           1 Para             No LMP recorded.  Age at menarche: 72 Age at menopause: 66 Hx of HRT: Denies Hx of STDs: Denies Last pap: Yes, see HPI.  Pap test prior to that in 2019 had been negative. History of abnormal pap smears: See HPI  SCREENING STUDIES:  Last mammogram: 2021  Last colonoscopy: 2021   MEDICATIONS: Outpatient Encounter Medications as of 06/27/2022  Medication Sig   atorvastatin (LIPITOR) 20 MG tablet TAKE 1 TABLET BY MOUTH EVERY DAY AT NIGHT   baclofen (LIORESAL) 10 MG tablet Take 1 tablet by mouth 3 (three) times daily.   citalopram (CELEXA) 10 MG tablet Take 1 tablet by mouth daily.   diclofenac (VOLTAREN) 75 MG EC tablet Take by mouth.   fluticasone (  FLONASE) 50 MCG/ACT nasal spray SPRAY 1 SPRAY INTO EACH NOSTRIL EVERY DAY   lisinopril-hydrochlorothiazide (ZESTORETIC) 20-12.5 MG tablet Take 1 tablet by mouth daily.   No facility-administered encounter medications on file as of 06/27/2022.    ALLERGIES:  No Known Allergies   FAMILY HISTORY:  Family History  Problem Relation Age of Onset   Breast cancer Mother    Cancer Sister        stomach?   Cancer Maternal Aunt      SOCIAL HISTORY:  Social Connections: Not on file    REVIEW OF SYSTEMS:   Denies appetite changes, fevers, chills, fatigue, unexplained weight changes. Denies hearing loss, neck lumps or masses, mouth sores, ringing in ears or voice changes. Denies cough or wheezing.  Denies shortness of breath. Denies chest pain or palpitations. Denies leg swelling. Denies abdominal distention, pain, blood in stools, constipation, diarrhea, nausea, vomiting, or early satiety. Denies pain with intercourse, dysuria, frequency, hematuria or incontinence. Denies hot flashes, pelvic pain, vaginal bleeding or vaginal discharge.   Denies joint pain, back pain or muscle pain/cramps. Denies itching, rash, or wounds. Denies dizziness, headaches, numbness or seizures. Denies swollen lymph nodes or glands, denies easy bruising or bleeding. Denies anxiety, depression, confusion, or decreased concentration.  Physical Exam:  Vital Signs for this encounter:  Blood pressure 131/71, pulse 73, temperature 97.8 F (36.6 C), temperature source Oral, resp. rate 16, height 5' 5.75" (1.67 m), weight 164 lb (74.4 kg), SpO2 100 %. Body mass index is 26.67 kg/m. General: Alert, oriented, no acute distress.  HEENT: Normocephalic, atraumatic. Sclera anicteric.  Chest: Clear to auscultation bilaterally. No wheezes, rhonchi, or rales. Cardiovascular: Regular rate and rhythm, no murmurs, rubs, or gallops.  Abdomen: Normoactive bowel sounds. Soft, nondistended, nontender to palpation. No masses or hepatosplenomegaly appreciated. No palpable fluid wave.  Extremities: Grossly normal range of motion. Warm, well perfused. No edema bilaterally.  Skin: No rashes or lesions.  Lymphatics: No cervical, supraclavicular, or inguinal adenopathy.  GU:  Normal external female genitalia. No lesions. No discharge or bleeding.             Bladder/urethra:  No lesions or masses, well supported bladder             Vagina: Mildly atrophic vaginal mucosa.  No lesions or masses.             Cervix: Normal appearing, no  lesions.             Uterus: Small, mobile, no parametrial involvement or nodularity.             Adnexa: No masses appreciated.  Rectal: Deferred.  LABORATORY AND RADIOLOGIC DATA:  Outside medical records were reviewed to synthesize the above history, along with the history and physical obtained during the visit.

## 2022-06-27 NOTE — Progress Notes (Signed)
Patient here with her daughter in law for new patient consultation with Dr. Jeral Pinch and for a pre-operative discussion prior to her scheduled surgery on July 23, 2022. She is scheduled for robotic assisted total laparoscopic hysterectomy, bilateral salpingo-oophorectomy, sentinel lymph node biopsy, possible lymph node dissection, possible laparotomy. The surgery was discussed in detail.  See after visit summary for additional details. Visual aids used to discuss items related to surgery including the sequential compression stockings, foley catheter, IV pump, multi-modal pain regimen including tylenol, photo of the surgical robot, female reproductive system to discuss surgery in detail.      Discussed post-op pain management in detail including the aspects of the enhanced recovery pathway.  Advised her that a new prescription would be sent in for tramadol and it is only to be used for after her upcoming surgery.  We discussed the use of tylenol post-op and to monitor for a maximum of 4,000 mg in a 24 hour period.  Also prescribed sennakot to be used after surgery and to hold if having loose stools.  Discussed bowel regimen in detail.     Discussed the use of SCDs and measures to take at home to prevent DVT including frequent mobility.  Reportable signs and symptoms of DVT discussed. Post-operative instructions discussed and expectations for after surgery. Incisional care discussed as well including reportable signs and symptoms including erythema, drainage, wound separation.     10 minutes spent with the patient.  Verbalizing understanding of material discussed. No needs or concerns voiced at the end of the visit.   Advised patient and family to call for any needs.  Advised that her post-operative medications had been prescribed and could be picked up at any time.    This appointment is included in the global surgical bundle as pre-operative teaching and has no charge.

## 2022-06-27 NOTE — H&P (View-Only) (Signed)
GYNECOLOGIC ONCOLOGY NEW PATIENT CONSULTATION   Patient Name: PANTERA WINTERROWD  Patient Age: 61 y.o. Date of Service: 06/27/22 Referring Provider: Billee Cashing MD  Primary Care Provider: No primary care provider on file. 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  We reviewed the nature of endometrial cancer and its recommended surgical staging, including total hysterectomy, bilateral salpingo-oophorectomy, and lymph node assessment. The patient is a suitable candidate for staging via a minimally invasive approach to surgery.  We reviewed that robotic assistance would be used to complete the surgery.   We discussed that most endometrial cancer is detected early and that decisions regarding adjuvant therapy will be made based on her final pathology.   We reviewed the sentinel lymph node technique. Risks and benefits of sentinel lymph node biopsy was reviewed. We reviewed the technique and ICG dye. The patient DOES NOT have an iodine allergy or known liver dysfunction. We reviewed the false negative rate (0.4%), and that 3% of patients with metastatic disease will not have it detected by SLN biopsy in endometrial cancer. A low risk of allergic reaction to the dye, <0.2% for ICG, has been reported. We also discussed that in the case of failed mapping, which occurs 40% of the time, a bilateral or unilateral lymphadenectomy will be performed at the surgeon's discretion.   Potential benefits of sentinel nodes including a higher detection rate for metastasis due to ultrastaging and potential reduction in operative morbidity. However, there remains uncertainty as to the role for treatment of micrometastatic disease. Further, the benefit of operative morbidity associated with the SLN technique in endometrial cancer is not yet completely known. In other patient populations (e.g.  the cervical cancer population) there has been observed reductions in morbidity with SLN biopsy compared to pelvic lymphadenectomy. Lymphedema, nerve dysfunction and lymphocysts are all potential risks with the SLN technique as with complete lymphadenectomy. Additional risks to the patient include the risk of damage to an internal organ while operating in an altered view (e.g. the black and white image of the robotic fluorescence imaging mode).   We discussed the plan for a robotic assisted hysterectomy, bilateral salpingo-oophorectomy, sentinel lymph node evaluation, possible lymph node dissection, possible laparotomy. The risks of surgery were discussed in detail and she understands these to include infection; wound separation; hernia; vaginal cuff separation, injury to adjacent organs such as bowel, bladder, blood vessels, ureters and nerves; bleeding which may require blood transfusion; anesthesia risk; thromboembolic events; possible death; unforeseen complications; possible need for re-exploration; medical complications such as heart attack, stroke, pleural effusion and pneumonia; and, if full lymphadenectomy is performed the risk of lymphedema and lymphocyst. The patient will receive DVT and antibiotic prophylaxis as indicated. She voiced a clear understanding. She had the opportunity to ask questions. Perioperative instructions were reviewed with her. Prescriptions for post-op medications were sent to her pharmacy of choice.  Given her history of stroke, will reach out to her primary care provider for clearance.  Will also assure that she is a candidate to receive preoperative heparin.  Discussed goals of surgery to keep time under anesthesia limited.  Will also plan for same-day discharge given her memory deficits.  A copy of this note was sent to the patient's referring provider.   65 minutes of total time was spent for this patient encounter, including preparation, face-to-face counseling with the  patient and coordination of care, and documentation of the encounter.   Belenda Cruise  Berline Lopes, MD  Division of Gynecologic Oncology  Department of Obstetrics and Gynecology  [facility]  ___________________________________________  Chief Complaint: Chief Complaint  Patient presents with   Endometrial cancer [facility])    History of Present Illness:  CLAUDIE BRICKHOUSE is a 61 y.o. y.o. female who is seen in consultation at the request of Dr. Addison Bailey for an evaluation of endometrial cancer.  Patient presented with several years of intermittent postmenopausal spotting.  It appears that she had a Pap test with her primary care provider although I cannot see the results of this in care everywhere.  She was then referred to OB/GYN.  Pap test on 05/02/2022 showed atypical glandular cells suspicious for adenocarcinoma of the endometrium.  High risk HPV negative.  On 06/05/2022, patient underwent endocervical and endometrial sampling as well as colposcopy.  Colposcopy was satisfactory with no abnormalities of the cervix visualized.  Endometrial biopsy showed atypical glandular proliferation consistent with grade 1 endometrioid adenocarcinoma with villoglandular architecture.  Endocervical biopsy showed collections of benign squamous epithelial cells, no dysplasia or malignancy.  Today, the patient presents with her stepdaughter, [name].  She denies any vaginal bleeding except for small amount of spotting after her recent biopsy.  She denies any pelvic pain or cramping.  Patient endorses a good appetite without nausea or emesis.  Denies any recent weight changes.  She has baseline constipation, bowel movement every couple of days.  Denies taking any medication.  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  Primary care provider manages this.  She was previously seen a urologist.  Has a history of a stroke related to hypertension in 2008.  Has  significant left-sided deficits.  Walks with a cane.  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  Lives with her daughter-in-law and her daughter-in-law's husband as well as the patient's oldest daughter.  PAST MEDICAL HISTORY:  Past Medical History:  Diagnosis Date   Anxiety    Depression    History of short term memory loss    Due to stroke   HLD (hyperlipidemia)    Hypertension    Stroke ([place])    2008, HTN     PAST SURGICAL HISTORY:  History reviewed. No pertinent surgical history.  OB/GYN HISTORY:  OB History  Gravida Para Term Preterm AB Living  2 2          SAB IAB Ectopic Multiple Live Births               # Outcome Date GA Lbr Len/2nd Weight Sex Delivery Anes PTL Lv  2 Para           1 Para             No LMP recorded.  Age at menarche: 57 Age at menopause: 28 Hx of HRT: Denies Hx of STDs: Denies Last pap: Yes, see HPI.  Pap test prior to that in 2019 had been negative. History of abnormal pap smears: See HPI  SCREENING STUDIES:  Last mammogram: 2021  Last colonoscopy: 2021   MEDICATIONS: Outpatient Encounter Medications as of 06/27/2022  Medication Sig   atorvastatin (LIPITOR) 20 MG tablet TAKE 1 TABLET BY MOUTH EVERY DAY AT NIGHT   baclofen (LIORESAL) 10 MG tablet Take 1 tablet by mouth 3 (three) times daily.   citalopram (CELEXA) 10 MG tablet Take 1 tablet by mouth daily.   diclofenac (VOLTAREN) 75 MG EC tablet Take by mouth.   fluticasone (  FLONASE) 50 MCG/ACT nasal spray SPRAY 1 SPRAY INTO EACH NOSTRIL EVERY DAY   lisinopril-hydrochlorothiazide (ZESTORETIC) 20-12.5 MG tablet Take 1 tablet by mouth daily.   No facility-administered encounter medications on file as of 06/27/2022.    ALLERGIES:  No Known Allergies   FAMILY HISTORY:  Family History  Problem Relation Age of Onset   Breast cancer Mother    Cancer Sister        stomach?   Cancer Maternal Aunt      SOCIAL HISTORY:  Social Connections: Not on file    REVIEW OF SYSTEMS:   Denies appetite changes, fevers, chills, fatigue, unexplained weight changes. Denies hearing loss, neck lumps or masses, mouth sores, ringing in ears or voice changes. Denies cough or wheezing.  Denies shortness of breath. Denies chest pain or palpitations. Denies leg swelling. Denies abdominal distention, pain, blood in stools, constipation, diarrhea, nausea, vomiting, or early satiety. Denies pain with intercourse, dysuria, frequency, hematuria or incontinence. Denies hot flashes, pelvic pain, vaginal bleeding or vaginal discharge.   Denies joint pain, back pain or muscle pain/cramps. Denies itching, rash, or wounds. Denies dizziness, headaches, numbness or seizures. Denies swollen lymph nodes or glands, denies easy bruising or bleeding. Denies anxiety, depression, confusion, or decreased concentration.  Physical Exam:  Vital Signs for this encounter:  Blood pressure 131/71, pulse 73, temperature 97.8 F (36.6 C), temperature source Oral, resp. rate 16, height 5' 5.75" (1.67 m), weight 164 lb (74.4 kg), SpO2 100 %. Body mass index is 26.67 kg/m. General: Alert, oriented, no acute distress.  HEENT: Normocephalic, atraumatic. Sclera anicteric.  Chest: Clear to auscultation bilaterally. No wheezes, rhonchi, or rales. Cardiovascular: Regular rate and rhythm, no murmurs, rubs, or gallops.  Abdomen: Normoactive bowel sounds. Soft, nondistended, nontender to palpation. No masses or hepatosplenomegaly appreciated. No palpable fluid wave.  Extremities: Grossly normal range of motion. Warm, well perfused. No edema bilaterally.  Skin: No rashes or lesions.  Lymphatics: No cervical, supraclavicular, or inguinal adenopathy.  GU:  Normal external female genitalia. No lesions. No discharge or bleeding.             Bladder/urethra:  No lesions or masses, well supported bladder             Vagina: Mildly atrophic vaginal mucosa.  No lesions or masses.             Cervix: Normal appearing, no  lesions.             Uterus: Small, mobile, no parametrial involvement or nodularity.             Adnexa: No masses appreciated.  Rectal: Deferred.  LABORATORY AND RADIOLOGIC DATA:  Outside medical records were reviewed to synthesize the above history, along with the history and physical obtained during the visit.

## 2022-06-28 ENCOUNTER — Other Ambulatory Visit: Payer: Self-pay

## 2022-06-28 ENCOUNTER — Telehealth: Payer: Self-pay | Admitting: *Deleted

## 2022-06-28 NOTE — Telephone Encounter (Signed)
Per Dr Berline Lopes fax office note and surgical optimization for to her PCP office

## 2022-07-08 NOTE — Patient Instructions (Signed)
DUE TO COVID-19 ONLY TWO VISITORS  (aged 63 and older)  ARE ALLOWED TO COME WITH YOU AND STAY IN THE WAITING ROOM ONLY DURING PRE OP AND PROCEDURE.   **NO VISITORS ARE ALLOWED IN THE SHORT STAY AREA OR RECOVERY ROOM!!**  IF YOU WILL BE ADMITTED INTO THE HOSPITAL YOU ARE ALLOWED ONLY FOUR SUPPORT PEOPLE DURING VISITATION HOURS ONLY (7 AM -8PM)   The support person(s) must pass our screening, gel in and out, and wear a mask at all times, including in the patient's room. Patients must also wear a mask when staff or their support person are in the room. Visitors GUEST BADGE MUST BE WORN VISIBLY  One adult visitor may remain with you overnight and MUST be in the room by 8 P.M.   Your procedure is scheduled on: 07/23/22   Report to [facility] Main Entrance    Report to admitting at  5:30 AM   Call this number if you have problems the morning of surgery [phone]   Follow a light diet the day before surgery.    After Midnight on 07/23/22 you may have the following liquids until _4:30_ AM/ DAY OF SURGERY  Water Black Coffee (sugar ok, NO MILK/CREAM OR CREAMERS)  Tea (sugar ok, NO MILK/CREAM OR CREAMERS) regular and decaf                             Plain Jell-O (NO RED)                                           Fruit ices (not with fruit pulp, NO RED)                                     Popsicles (NO RED)                                                                  Juice: apple, WHITE grape, WHITE cranberry Sports drinks like Gatorade (NO RED)      If you have questions, please contact your surgeon's office.  Oral Hygiene is also important to reduce your risk of infection.                                    Remember - BRUSH YOUR TEETH THE MORNING OF SURGERY WITH YOUR REGULAR TOOTHPASTE   Take these medicines the morning of surgery with A SIP OF WATER: Citalopram, Lipitor             You may not have any metal on your body including hair pins, jewelry, and body piercing              Do not wear make-up, lotions, powders, perfumes/cologne, or deodorant  Do not wear nail polish including gel and S&S, artificial/acrylic nails, or any other type of covering on natural nails including finger and toenails. If you have artificial nails, gel coating, etc. that needs to be removed  by a nail salon please have this removed prior to surgery or surgery may need to be canceled/ delayed if the surgeon/ anesthesia feels like they are unable to be safely monitored.   Do not shave  48 hours prior to surgery.    Do not bring valuables to the hospital. [place].  DO NOT [place].    Patients discharged on the day of surgery will not be allowed to drive home.  Someone NEEDS to stay with you for the first 24 hours after anesthesia.              Please read over the following fact sheets you were given: IF YOU HAVE QUESTIONS ABOUT YOUR PRE-OP INSTRUCTIONS PLEASE CALL [place] - Preparing for Surgery Before surgery, you can play an important role.  Because skin is not sterile, your skin needs to be as free of germs as possible.  You can reduce the number of germs on your skin by washing with CHG (chlorahexidine gluconate) soap before surgery.  CHG is an antiseptic cleaner which kills germs and bonds with the skin to continue killing germs even after washing. Please DO NOT use if you have an allergy to CHG or antibacterial soaps.  If your skin becomes reddened/irritated stop using the CHG and inform your nurse when you arrive at Short Stay. Do not shave (including legs and underarms) for at least 48 hours prior to the first CHG shower.   Please follow these instructions carefully:  1.  Shower with CHG Soap the night before surgery and the  morning of Surgery.  2.  If you choose to wash your hair, wash your hair first as usual with your  normal  shampoo.  3.  After you shampoo,  rinse your hair and body thoroughly to remove the  shampoo.                            4.  Use CHG as you would any other liquid soap.  You can apply chg directly  to the skin and wash                       Gently with a scrungie or clean washcloth.  5.  Apply the CHG Soap to your body ONLY FROM THE NECK DOWN.   Do not use on face/ open                           Wound or open sores. Avoid contact with eyes, ears mouth and genitals (private parts).                       Wash face,  Genitals (private parts) with your normal soap.             6.  Wash thoroughly, paying special attention to the area where your surgery  will be performed.  7.  Thoroughly rinse your body with warm water from the neck down.  8.  DO NOT shower/wash with your normal soap after using and rinsing off  the CHG Soap.                9.  Pat yourself dry with a  clean towel.            10.  Wear clean pajamas.            11.  Place clean sheets on your bed the night of your first shower and do not  sleep with pets. Day of Surgery : Do not apply any lotions/deodorants the morning of surgery.  Please wear clean clothes to the hospital/surgery center.  FAILURE TO FOLLOW THESE INSTRUCTIONS MAY RESULT IN THE CANCELLATION OF YOUR SURGERY    ________________________________________________________________________   WHAT IS A BLOOD TRANSFUSION? Blood Transfusion Information  A transfusion is the replacement of blood or some of its parts. Blood is made up of multiple cells which provide different functions. Red blood cells carry oxygen and are used for blood loss replacement. White blood cells fight against infection. Platelets control bleeding. Plasma helps clot blood. Other blood products are available for specialized needs, such as hemophilia or other clotting disorders. BEFORE THE TRANSFUSION  Who gives blood for transfusions?  Healthy volunteers who are fully evaluated to make sure their blood is safe. This is blood bank  blood. Transfusion therapy is the safest it has ever been in the practice of medicine. Before blood is taken from a donor, a complete history is taken to make sure that person has no history of diseases nor engages in risky social behavior (examples are intravenous drug use or sexual activity with multiple partners). The donor's travel history is screened to minimize risk of transmitting infections, such as malaria. The donated blood is tested for signs of infectious diseases, such as HIV and hepatitis. The blood is then tested to be sure it is compatible with you in order to minimize the chance of a transfusion reaction. If you or a relative donates blood, this is often done in anticipation of surgery and is not appropriate for emergency situations. It takes many days to process the donated blood. RISKS AND COMPLICATIONS Although transfusion therapy is very safe and saves many lives, the main dangers of transfusion include:  Getting an infectious disease. Developing a transfusion reaction. This is an allergic reaction to something in the blood you were given. Every precaution is taken to prevent this. The decision to have a blood transfusion has been considered carefully by your caregiver before blood is given. Blood is not given unless the benefits outweigh the risks. AFTER THE TRANSFUSION Right after receiving a blood transfusion, you will usually feel much better and more energetic. This is especially true if your red blood cells have gotten low (anemic). The transfusion raises the level of the red blood cells which carry oxygen, and this usually causes an energy increase. The nurse administering the transfusion will monitor you carefully for complications. HOME CARE INSTRUCTIONS  No special instructions are needed after a transfusion. You may find your energy is better. Speak with your caregiver about any limitations on activity for underlying diseases you may have. SEEK MEDICAL CARE IF:  Your  condition is not improving after your transfusion. You develop redness or irritation at the intravenous (IV) site. SEEK IMMEDIATE MEDICAL CARE IF:  Any of the following symptoms occur over the next 12 hours: Shaking chills. You have a temperature by mouth above 102 F (38.9 C), not controlled by medicine. Chest, back, or muscle pain. People around you feel you are not acting correctly or are confused. Shortness of breath or difficulty breathing. Dizziness and fainting. You get a rash or develop hives. You have a decrease in urine output.  Your urine turns a dark color or changes to pink, red, or brown. Any of the following symptoms occur over the next 10 days: You have a temperature by mouth above 102 F (38.9 C), not controlled by medicine. Shortness of breath. Weakness after normal activity. The white part of the eye turns yellow (jaundice). You have a decrease in the amount of urine or are urinating less often. Your urine turns a dark color or changes to pink, red, or brown. Document Released: 11/01/2000 Document Revised: 01/27/2012 Document Reviewed: 06/20/2008 [facility] Patient Information 2014 [place], [place].  _______________________________________________________________________

## 2022-07-11 ENCOUNTER — Encounter (HOSPITAL_COMMUNITY): Payer: Self-pay

## 2022-07-11 ENCOUNTER — Telehealth: Payer: Self-pay

## 2022-07-11 ENCOUNTER — Encounter (HOSPITAL_COMMUNITY)
Admission: RE | Admit: 2022-07-11 | Discharge: 2022-07-11 | Disposition: A | Discharge status: Home / Self Care | Payer: Medicare Other | Source: Ambulatory Visit | Attending: Gynecologic Oncology | Admitting: Gynecologic Oncology

## 2022-07-11 DIAGNOSIS — C541 Malignant neoplasm of endometrium: Secondary | ICD-10-CM | POA: Insufficient documentation

## 2022-07-11 DIAGNOSIS — Z01818 Encounter for other preprocedural examination: Secondary | ICD-10-CM | POA: Diagnosis present

## 2022-07-11 DIAGNOSIS — I10 Essential (primary) hypertension: Secondary | ICD-10-CM | POA: Diagnosis not present

## 2022-07-11 HISTORY — DX: Unspecified osteoarthritis, unspecified site: M19.90

## 2022-07-11 HISTORY — DX: Malignant (primary) neoplasm, unspecified: C80.1

## 2022-07-11 HISTORY — DX: Weakness: R53.1

## 2022-07-11 LAB — CBC
HCT: 39.5 % (ref 36.0–46.0)
Hemoglobin: 12.9 g/dL (ref 12.0–15.0)
MCH: 29.6 pg (ref 26.0–34.0)
MCHC: 32.7 g/dL (ref 30.0–36.0)
MCV: 90.6 fL (ref 80.0–100.0)
Platelets: 216 10*3/uL (ref 150–400)
RBC: 4.36 MIL/uL (ref 3.87–5.11)
RDW: 13 % (ref 11.5–15.5)
WBC: 7.2 10*3/uL (ref 4.0–10.5)
nRBC: 0 % (ref 0.0–0.2)

## 2022-07-11 LAB — COMPREHENSIVE METABOLIC PANEL
ALT: 24 U/L (ref 0–44)
AST: 19 U/L (ref 15–41)
Albumin: 4 g/dL (ref 3.5–5.0)
Alkaline Phosphatase: 94 U/L (ref 38–126)
Anion gap: 8 (ref 5–15)
BUN: 23 mg/dL (ref 8–23)
CO2: 28 mmol/L (ref 22–32)
Calcium: 10.3 mg/dL (ref 8.9–10.3)
Chloride: 106 mmol/L (ref 98–111)
Creatinine, Ser: 1.13 mg/dL — ABNORMAL HIGH (ref 0.44–1.00)
GFR, Estimated: 55 mL/min — ABNORMAL LOW (ref >60)
Glucose, Bld: 110 mg/dL — ABNORMAL HIGH (ref 70–99)
Potassium: 3.7 mmol/L (ref 3.5–5.1)
Sodium: 142 mmol/L (ref 135–145)
Total Bilirubin: 0.4 mg/dL (ref 0.3–1.2)
Total Protein: 7.5 g/dL (ref 6.5–8.1)

## 2022-07-11 LAB — TYPE AND SCREEN
ABO/RH(D): O POS
Antibody Screen: NEGATIVE

## 2022-07-11 NOTE — Progress Notes (Addendum)
Sign consent DOS  COVID Vaccine Completed: no  Date of COVID positive in last 90 days: no  PCP - Dr. Huel Cote Cardiologist -  n/a  Chest x-ray - n/a EKG - 07/11/22 Epic/chart Stress Test - n/a ECHO - 2020 [facility] Cardiac Cath - n/a Pacemaker/ICD device last checked: n/a Spinal Cord Stimulator: n/a  Bowel Prep - light diet day before  Sleep Study - n/a CPAP -   Fasting Blood Sugar - n/a Checks Blood Sugar _____ times a day  Blood Thinner Instructions: n/a Aspirin Instructions: Last Dose:  Activity level: Can go up a flight of stairs and perform activities of daily living without stopping and without symptoms of chest pain or shortness of breath. Left side weakness post stroke   Anesthesia review:1st degree AV block on EKG  Patient denies shortness of breath, fever, cough and chest pain at PAT appointment  Patient verbalized understanding of instructions that were given to them at the PAT appointment. Patient was also instructed that they will need to review over the PAT instructions again at home before surgery.

## 2022-07-11 NOTE — Telephone Encounter (Signed)
Told daughter Ms Hupp that Ms Tostenson creatine was a little elevated at 1.13 on her pre-op labs per Joylene John, NP. She needs to take in at least 64 oz of caffeine free water a day. Ms Hupp states that Ms Bittick takes in a lot of caffeinated drinks.Ms Hupp will have her drink water to make sure her kidney function is good for her surgery. She needs to avoid NSAIDS such as ibuprofen and naproxen with the elevated kidney function prior to surgery. Pt uses Voltaren 75 mg at bedtime. This was prescribed by Dr. Huel Cote (PCP). Will send a copy of this lab work to Dr. Huel Cote for her to review. Told Ms Hupp to call Dr. Huel Cote to see if it is ok for her to take the Voltaren q hs with elevated creatine per Joylene John, NP.

## 2022-07-19 ENCOUNTER — Telehealth: Payer: Self-pay

## 2022-07-19 NOTE — Telephone Encounter (Signed)
Telephone call to check on pre-operative status. I spoke to Denise Harris daughter, Denise Harris, who states she takes care of her mom and will be bringing her mom to hospital.   Denise Harris is compliant with pre-operative instructions.  Reinforced nothing to eat after midnight. Clear liquids until 4:30am. Patient to arrive at 5:30am.  No questions or concerns voiced.  Instructed to call for any needs.

## 2022-07-21 ENCOUNTER — Encounter (HOSPITAL_COMMUNITY): Payer: Self-pay | Admitting: Gynecologic Oncology

## 2022-07-21 NOTE — Anesthesia Preprocedure Evaluation (Addendum)
Anesthesia Evaluation  Patient identified by MRN, date of birth, ID band Patient awake    Reviewed: Allergy & Precautions, NPO status , Patient's Chart, lab work & pertinent test results, reviewed documented beta blocker date and time   Airway Mallampati: III  TM Distance: >3 FB Neck ROM: Limited    Dental  (+) Poor Dentition, Missing, Dental Advisory Given, Chipped,    Pulmonary neg pulmonary ROS,    Pulmonary exam normal breath sounds clear to auscultation       Cardiovascular hypertension, Pt. 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, Residual Symptoms    GI/Hepatic negative GI ROS, Neg liver ROS,   Endo/Other  Hyperlipidemia  Renal/GU Renal InsufficiencyRenal disease Bladder dysfunction  Overactive bladder    Musculoskeletal  (+) Arthritis , Osteoarthritis,    Abdominal   Peds  Hematology negative hematology ROS (+)   Anesthesia Other Findings   Reproductive/Obstetrics Enndometrial                           Anesthesia Physical Anesthesia Plan  ASA: 3  Anesthesia Plan: General   Post-op Pain Management: Celebrex PO (pre-op)* and Tylenol PO (pre-op)*   Induction: Intravenous  PONV Risk Score and Plan: 4 or greater and Treatment may vary due to age or medical condition, Ondansetron and Dexamethasone  Airway Management Planned: Oral ETT  Additional Equipment: None  Intra-op Plan:   Post-operative Plan: Extubation in OR  Informed Consent: I have reviewed the patients History and Physical, chart, labs and discussed the procedure including the risks, benefits and alternatives for the proposed anesthesia with the patient or authorized representative who has  indicated his/her understanding and acceptance.     Dental advisory given  Plan Discussed with: Anesthesiologist and CRNA  Anesthesia Plan Comments:       Anesthesia Quick Evaluation

## 2022-07-23 ENCOUNTER — Other Ambulatory Visit: Payer: Self-pay

## 2022-07-23 ENCOUNTER — Encounter (HOSPITAL_COMMUNITY)
Admission: RE | Disposition: A | Discharge status: Home / Self Care | Payer: Self-pay | Source: Ambulatory Visit | Attending: Gynecologic Oncology

## 2022-07-23 ENCOUNTER — Ambulatory Visit (HOSPITAL_COMMUNITY): Payer: Medicare Other | Admitting: Physician Assistant

## 2022-07-23 ENCOUNTER — Ambulatory Visit (HOSPITAL_BASED_OUTPATIENT_CLINIC_OR_DEPARTMENT_OTHER): Payer: Medicare Other | Admitting: Anesthesiology

## 2022-07-23 ENCOUNTER — Ambulatory Visit (HOSPITAL_COMMUNITY)
Admission: RE | Admit: 2022-07-23 | Discharge: 2022-07-23 | Disposition: A | Discharge status: Home / Self Care | Payer: Medicare Other | Source: Ambulatory Visit | Attending: Gynecologic Oncology | Admitting: Gynecologic Oncology

## 2022-07-23 ENCOUNTER — Encounter (HOSPITAL_COMMUNITY): Payer: Self-pay | Admitting: Gynecologic Oncology

## 2022-07-23 DIAGNOSIS — M1990 Unspecified osteoarthritis, unspecified site: Secondary | ICD-10-CM | POA: Insufficient documentation

## 2022-07-23 DIAGNOSIS — N289 Disorder of kidney and ureter, unspecified: Secondary | ICD-10-CM | POA: Diagnosis not present

## 2022-07-23 DIAGNOSIS — I69354 Hemiplegia and hemiparesis following cerebral infarction affecting left non-dominant side: Secondary | ICD-10-CM | POA: Insufficient documentation

## 2022-07-23 DIAGNOSIS — I10 Essential (primary) hypertension: Secondary | ICD-10-CM

## 2022-07-23 DIAGNOSIS — C541 Malignant neoplasm of endometrium: Secondary | ICD-10-CM

## 2022-07-23 DIAGNOSIS — Z780 Asymptomatic menopausal state: Secondary | ICD-10-CM | POA: Insufficient documentation

## 2022-07-23 DIAGNOSIS — F419 Anxiety disorder, unspecified: Secondary | ICD-10-CM

## 2022-07-23 DIAGNOSIS — Z8673 Personal history of transient ischemic attack (TIA), and cerebral infarction without residual deficits: Secondary | ICD-10-CM | POA: Diagnosis not present

## 2022-07-23 DIAGNOSIS — I69311 Memory deficit following cerebral infarction: Secondary | ICD-10-CM | POA: Diagnosis not present

## 2022-07-23 DIAGNOSIS — F32A Depression, unspecified: Secondary | ICD-10-CM | POA: Diagnosis not present

## 2022-07-23 HISTORY — PX: ROBOTIC ASSISTED TOTAL HYSTERECTOMY WITH BILATERAL SALPINGO OOPHERECTOMY: SHX6086

## 2022-07-23 HISTORY — PX: SENTINEL NODE BIOPSY: SHX6608

## 2022-07-23 LAB — ABO/RH: ABO/RH(D): O POS

## 2022-07-23 SURGERY — HYSTERECTOMY, TOTAL, ROBOT-ASSISTED, LAPAROSCOPIC, WITH BILATERAL SALPINGO-OOPHORECTOMY
Anesthesia: General

## 2022-07-23 MED ORDER — BUPIVACAINE HCL 0.25 % IJ SOLN
INTRAMUSCULAR | Status: DC | PRN
  Administered 2022-07-23: 32 mL

## 2022-07-23 MED ORDER — PROPOFOL 10 MG/ML IV BOLUS
INTRAVENOUS | Status: AC
  Filled 2022-07-23: qty 20

## 2022-07-23 MED ORDER — LACTATED RINGERS IR SOLN
Status: DC | PRN
  Administered 2022-07-23: 1000 mL

## 2022-07-23 MED ORDER — ORAL CARE MOUTH RINSE: 15.0000 mL | Freq: Once | OROMUCOSAL | Status: AC

## 2022-07-23 MED ORDER — DEXAMETHASONE SODIUM PHOSPHATE 10 MG/ML IJ SOLN
INTRAMUSCULAR | Status: AC
  Filled 2022-07-23: qty 1

## 2022-07-23 MED ORDER — PHENYLEPHRINE 80 MCG/ML (10ML) SYRINGE FOR IV PUSH (FOR BLOOD PRESSURE SUPPORT)
PREFILLED_SYRINGE | INTRAVENOUS | Status: DC | PRN
  Administered 2022-07-23: 80 ug via INTRAVENOUS

## 2022-07-23 MED ORDER — OXYCODONE HCL 5 MG/5ML PO SOLN: 5.0000 mg | Freq: Once | ORAL | Status: DC | PRN

## 2022-07-23 MED ORDER — FENTANYL CITRATE (PF) 100 MCG/2ML IJ SOLN
INTRAMUSCULAR | Status: AC
  Filled 2022-07-23: qty 2

## 2022-07-23 MED ORDER — LACTATED RINGERS IV SOLN: INTRAVENOUS | Status: DC

## 2022-07-23 MED ORDER — STERILE WATER FOR IRRIGATION IR SOLN
Status: DC | PRN
  Administered 2022-07-23: 1000 mL

## 2022-07-23 MED ORDER — DEXAMETHASONE SODIUM PHOSPHATE 4 MG/ML IJ SOLN: 4.0000 mg | INTRAMUSCULAR | Status: DC

## 2022-07-23 MED ORDER — ROCURONIUM BROMIDE 10 MG/ML (PF) SYRINGE
PREFILLED_SYRINGE | INTRAVENOUS | Status: AC
  Filled 2022-07-23: qty 10

## 2022-07-23 MED ORDER — CHLORHEXIDINE GLUCONATE 0.12 % MT SOLN
15.0000 mL | Freq: Once | OROMUCOSAL | Status: AC
  Administered 2022-07-23: 15 mL via OROMUCOSAL

## 2022-07-23 MED ORDER — ONDANSETRON HCL 4 MG/2ML IJ SOLN: 4.0000 mg | Freq: Once | INTRAMUSCULAR | Status: DC | PRN

## 2022-07-23 MED ORDER — ROCURONIUM BROMIDE 10 MG/ML (PF) SYRINGE
PREFILLED_SYRINGE | INTRAVENOUS | Status: DC | PRN
  Administered 2022-07-23: 10 mg via INTRAVENOUS
  Administered 2022-07-23: 20 mg via INTRAVENOUS
  Administered 2022-07-23: 70 mg via INTRAVENOUS

## 2022-07-23 MED ORDER — PHENYLEPHRINE 80 MCG/ML (10ML) SYRINGE FOR IV PUSH (FOR BLOOD PRESSURE SUPPORT)
PREFILLED_SYRINGE | INTRAVENOUS | Status: AC
  Filled 2022-07-23: qty 10

## 2022-07-23 MED ORDER — BUPIVACAINE HCL 0.25 % IJ SOLN
INTRAMUSCULAR | Status: AC
  Filled 2022-07-23: qty 1

## 2022-07-23 MED ORDER — MIDAZOLAM HCL 5 MG/5ML IJ SOLN
INTRAMUSCULAR | Status: DC | PRN
  Administered 2022-07-23: 2 mg via INTRAVENOUS

## 2022-07-23 MED ORDER — LIDOCAINE 2% (20 MG/ML) 5 ML SYRINGE
INTRAMUSCULAR | Status: DC | PRN
  Administered 2022-07-23: 60 mg via INTRAVENOUS

## 2022-07-23 MED ORDER — CEFAZOLIN SODIUM-DEXTROSE 2-4 GM/100ML-% IV SOLN
2.0000 g | INTRAVENOUS | Status: AC
  Administered 2022-07-23: 2 g via INTRAVENOUS
  Filled 2022-07-23: qty 100

## 2022-07-23 MED ORDER — MIDAZOLAM HCL 2 MG/2ML IJ SOLN
INTRAMUSCULAR | Status: AC
  Filled 2022-07-23: qty 2

## 2022-07-23 MED ORDER — LABETALOL HCL 5 MG/ML IV SOLN
INTRAVENOUS | Status: DC | PRN
  Administered 2022-07-23: 5 mg via INTRAVENOUS

## 2022-07-23 MED ORDER — ACETAMINOPHEN 500 MG PO TABS
1000.0000 mg | ORAL_TABLET | ORAL | Status: AC
  Administered 2022-07-23: 1000 mg via ORAL
  Filled 2022-07-23: qty 2

## 2022-07-23 MED ORDER — STERILE WATER FOR INJECTION IJ SOLN
INTRAMUSCULAR | Status: DC | PRN
  Administered 2022-07-23: 10 mL

## 2022-07-23 MED ORDER — AMISULPRIDE (ANTIEMETIC) 5 MG/2ML IV SOLN
10.0000 mg | Freq: Once | INTRAVENOUS | Status: DC | PRN
Start: 2022-07-23 — End: 2022-07-23

## 2022-07-23 MED ORDER — LABETALOL HCL 5 MG/ML IV SOLN
INTRAVENOUS | Status: AC
  Filled 2022-07-23: qty 4

## 2022-07-23 MED ORDER — HYDROMORPHONE HCL 1 MG/ML IJ SOLN
INTRAMUSCULAR | Status: AC
  Filled 2022-07-23: qty 1

## 2022-07-23 MED ORDER — OXYCODONE HCL 5 MG PO TABS: 5.0000 mg | ORAL_TABLET | Freq: Once | ORAL | Status: DC | PRN

## 2022-07-23 MED ORDER — PROPOFOL 10 MG/ML IV BOLUS
INTRAVENOUS | Status: DC | PRN
  Administered 2022-07-23: 140 mg via INTRAVENOUS

## 2022-07-23 MED ORDER — HEPARIN SODIUM (PORCINE) 5000 UNIT/ML IJ SOLN
5000.0000 [IU] | INTRAMUSCULAR | Status: AC
  Administered 2022-07-23: 5000 [IU] via SUBCUTANEOUS
  Filled 2022-07-23: qty 1

## 2022-07-23 MED ORDER — ONDANSETRON HCL 4 MG/2ML IJ SOLN
INTRAMUSCULAR | Status: AC
Start: 2022-07-23 — End: ?
  Filled 2022-07-23: qty 2

## 2022-07-23 MED ORDER — SUGAMMADEX SODIUM 200 MG/2ML IV SOLN
INTRAVENOUS | Status: DC | PRN
  Administered 2022-07-23: 200 mg via INTRAVENOUS

## 2022-07-23 MED ORDER — LABETALOL HCL 5 MG/ML IV SOLN
5.0000 mg | Freq: Once | INTRAVENOUS | Status: AC
  Administered 2022-07-23: 5 mg via INTRAVENOUS

## 2022-07-23 MED ORDER — HYDROMORPHONE HCL 1 MG/ML IJ SOLN
0.2500 mg | INTRAMUSCULAR | Status: DC | PRN
  Administered 2022-07-23: 0.5 mg via INTRAVENOUS

## 2022-07-23 MED ORDER — STERILE WATER FOR INJECTION IJ SOLN
INTRAMUSCULAR | Status: AC
  Filled 2022-07-23: qty 10

## 2022-07-23 MED ORDER — LIDOCAINE HCL (PF) 2 % IJ SOLN
INTRAMUSCULAR | Status: AC
  Filled 2022-07-23: qty 10

## 2022-07-23 MED ORDER — HEMOSTATIC AGENTS (NO CHARGE) OPTIME
TOPICAL | Status: DC | PRN
  Administered 2022-07-23: 1 via TOPICAL

## 2022-07-23 MED ORDER — FENTANYL CITRATE (PF) 100 MCG/2ML IJ SOLN
INTRAMUSCULAR | Status: DC | PRN
  Administered 2022-07-23: 100 ug via INTRAVENOUS
  Administered 2022-07-23 (×2): 50 ug via INTRAVENOUS

## 2022-07-23 MED ORDER — LIDOCAINE HCL (PF) 2 % IJ SOLN
INTRAMUSCULAR | Status: DC | PRN
  Administered 2022-07-23: 1 mg/kg/h via INTRADERMAL

## 2022-07-23 MED ORDER — EPHEDRINE 5 MG/ML INJ
INTRAVENOUS | Status: AC
  Filled 2022-07-23: qty 5

## 2022-07-23 MED ORDER — STERILE WATER FOR INJECTION IJ SOLN
INTRAMUSCULAR | Status: DC | PRN
  Administered 2022-07-23: 10 mL via INTRAMUSCULAR

## 2022-07-23 SURGICAL SUPPLY — 78 items
ADH SKN CLS APL DERMABOND .7 (GAUZE/BANDAGES/DRESSINGS) ×2
AGENT HMST KT MTR STRL THRMB (HEMOSTASIS)
APL ESCP 34 STRL LF DISP (HEMOSTASIS)
APL SRG 38 LTWT LNG FL B (MISCELLANEOUS) ×2
APPLICATOR ARISTA FLEXITIP XL (MISCELLANEOUS) IMPLANT
APPLICATOR SURGIFLO ENDO (HEMOSTASIS) IMPLANT
BAG LAPAROSCOPIC 12 15 PORT 16 (BASKET) IMPLANT
BAG RETRIEVAL 12/15 (BASKET)
BLADE SURG SZ10 CARB STEEL (BLADE) IMPLANT
COVER BACK TABLE 60X90IN (DRAPES) ×2 IMPLANT
COVER TIP SHEARS 8 DVNC (MISCELLANEOUS) ×2 IMPLANT
COVER TIP SHEARS 8MM DA VINCI (MISCELLANEOUS) ×2
DERMABOND ADVANCED (GAUZE/BANDAGES/DRESSINGS) ×2
DERMABOND ADVANCED .7 DNX12 (GAUZE/BANDAGES/DRESSINGS) ×2 IMPLANT
DRAPE ARM DVNC X/XI (DISPOSABLE) ×8 IMPLANT
DRAPE COLUMN DVNC XI (DISPOSABLE) ×2 IMPLANT
DRAPE DA VINCI XI ARM (DISPOSABLE) ×8
DRAPE DA VINCI XI COLUMN (DISPOSABLE) ×2
DRAPE SHEET LG 3/4 BI-LAMINATE (DRAPES) ×2 IMPLANT
DRAPE SURG IRRIG POUCH 19X23 (DRAPES) ×2 IMPLANT
DRSG OPSITE POSTOP 4X6 (GAUZE/BANDAGES/DRESSINGS) IMPLANT
DRSG OPSITE POSTOP 4X8 (GAUZE/BANDAGES/DRESSINGS) IMPLANT
ELECT PENCIL ROCKER SW 15FT (MISCELLANEOUS) IMPLANT
ELECT REM PT RETURN 15FT ADLT (MISCELLANEOUS) ×2 IMPLANT
GAUZE 4X4 16PLY ~~LOC~~+RFID DBL (SPONGE) ×4 IMPLANT
GLOVE BIO SURGEON STRL SZ 6 (GLOVE) ×8 IMPLANT
GLOVE BIO SURGEON STRL SZ 6.5 (GLOVE) ×2 IMPLANT
GOWN STRL REUS W/ TWL LRG LVL3 (GOWN DISPOSABLE) ×8 IMPLANT
GOWN STRL REUS W/TWL LRG LVL3 (GOWN DISPOSABLE) ×8
HEMOSTAT ARISTA ABSORB 3G PWDR (HEMOSTASIS) IMPLANT
HOLDER FOLEY CATH W/STRAP (MISCELLANEOUS) IMPLANT
IRRIG SUCT STRYKERFLOW 2 WTIP (MISCELLANEOUS) ×2
IRRIGATION SUCT STRKRFLW 2 WTP (MISCELLANEOUS) ×2 IMPLANT
KIT PROCEDURE DA VINCI SI (MISCELLANEOUS)
KIT PROCEDURE DVNC SI (MISCELLANEOUS) IMPLANT
KIT TURNOVER KIT A (KITS) IMPLANT
LIGASURE IMPACT 36 18CM CVD LR (INSTRUMENTS) IMPLANT
MANIPULATOR ADVINCU DEL 3.0 PL (MISCELLANEOUS) IMPLANT
MANIPULATOR ADVINCU DEL 3.5 PL (MISCELLANEOUS) IMPLANT
MANIPULATOR UTERINE 4.5 ZUMI (MISCELLANEOUS) IMPLANT
NDL HYPO 21X1.5 SAFETY (NEEDLE) ×2 IMPLANT
NDL SPNL 18GX3.5 QUINCKE PK (NEEDLE) IMPLANT
NEEDLE HYPO 21X1.5 SAFETY (NEEDLE) ×2 IMPLANT
NEEDLE SPNL 18GX3.5 QUINCKE PK (NEEDLE) ×2 IMPLANT
OBTURATOR OPTICAL STANDARD 8MM (TROCAR) ×2
OBTURATOR OPTICAL STND 8 DVNC (TROCAR) ×2
OBTURATOR OPTICALSTD 8 DVNC (TROCAR) ×2 IMPLANT
PACK ROBOT GYN CUSTOM WL (TRAY / TRAY PROCEDURE) ×2 IMPLANT
PAD POSITIONING PINK XL (MISCELLANEOUS) ×2 IMPLANT
PORT ACCESS TROCAR AIRSEAL 12 (TROCAR) ×2 IMPLANT
PORT ACCESS TROCAR AIRSEAL 5M (TROCAR) ×2
SCRUB CHG 4% DYNA-HEX 4OZ (MISCELLANEOUS) ×4 IMPLANT
SEAL CANN UNIV 5-8 DVNC XI (MISCELLANEOUS) ×8 IMPLANT
SEAL XI 5MM-8MM UNIVERSAL (MISCELLANEOUS) ×8
SET TRI-LUMEN FLTR TB AIRSEAL (TUBING) ×2 IMPLANT
SPIKE FLUID TRANSFER (MISCELLANEOUS) ×2 IMPLANT
SPONGE T-LAP 18X18 ~~LOC~~+RFID (SPONGE) IMPLANT
SURGIFLO W/THROMBIN 8M KIT (HEMOSTASIS) IMPLANT
SUT MNCRL AB 4-0 PS2 18 (SUTURE) IMPLANT
SUT PDS AB 1 TP1 96 (SUTURE) IMPLANT
SUT VIC AB 0 CT1 27 (SUTURE)
SUT VIC AB 0 CT1 27XBRD ANTBC (SUTURE) IMPLANT
SUT VIC AB 2-0 CT1 27 (SUTURE)
SUT VIC AB 2-0 CT1 TAPERPNT 27 (SUTURE) IMPLANT
SUT VIC AB 2-0 SH 27 (SUTURE) ×2
SUT VIC AB 2-0 SH 27X BRD (SUTURE) IMPLANT
SUT VIC AB 4-0 PS2 18 (SUTURE) ×4 IMPLANT
SYR 10ML LL (SYRINGE) IMPLANT
SYS BAG RETRIEVAL 10MM (BASKET)
SYS WOUND ALEXIS 18CM MED (MISCELLANEOUS)
SYSTEM BAG RETRIEVAL 10MM (BASKET) IMPLANT
SYSTEM WOUND ALEXIS 18CM MED (MISCELLANEOUS) IMPLANT
TOWEL OR NON WOVEN STRL DISP B (DISPOSABLE) IMPLANT
TRAP SPECIMEN MUCUS 40CC (MISCELLANEOUS) IMPLANT
TRAY FOLEY MTR SLVR 16FR STAT (SET/KITS/TRAYS/PACK) ×2 IMPLANT
UNDERPAD 30X36 HEAVY ABSORB (UNDERPADS AND DIAPERS) ×4 IMPLANT
WATER STERILE IRR 1000ML POUR (IV SOLUTION) ×2 IMPLANT
YANKAUER SUCT BULB TIP 10FT TU (MISCELLANEOUS) IMPLANT

## 2022-07-23 NOTE — Interval H&P Note (Signed)
History and Physical Interval Note:  07/23/2022 7:04 AM  Denise Harris  has presented today for surgery, with the diagnosis of ENDOMETRIAL CANCER.  The various methods of treatment have been discussed with the patient and family. After consideration of risks, benefits and other options for treatment, the patient has consented to  Procedure(s): XI ROBOTIC ASSISTED TOTAL HYSTERECTOMY WITH BILATERAL SALPINGO OOPHORECTOMY;POSSIBLE LAPAROTOMY (Bilateral) SENTINEL NODE BIOPSY (N/A) LYMPH NODE DISSECTION (N/A) as a surgical intervention.  The patient's history has been reviewed, patient examined, no change in status, stable for surgery.  I have reviewed the patient's chart and labs.  Questions were answered to the patient's satisfaction.     Denise Harris

## 2022-07-23 NOTE — Anesthesia Postprocedure Evaluation (Signed)
Anesthesia Post Note  Patient: Denise Harris  Procedure(s) Performed: XI ROBOTIC ASSISTED TOTAL HYSTERECTOMY WITH BILATERAL SALPINGO OOPHORECTOMY (Bilateral) SENTINEL NODE BIOPSY     Patient location during evaluation: PACU Anesthesia Type: General Level of consciousness: awake and alert and oriented Pain management: pain level controlled Vital Signs Assessment: post-procedure vital signs reviewed and stable Respiratory status: spontaneous breathing, nonlabored ventilation and respiratory function stable Cardiovascular status: blood pressure returned to baseline and stable Postop Assessment: no apparent nausea or vomiting Anesthetic complications: no   No notable events documented.  Last Vitals:  Vitals:   07/23/22 1015 07/23/22 1030  BP: (!) 169/78 (!) 177/88  Pulse: 73 74  Resp: 12 12  Temp:    SpO2: 100% 98%    Last Pain:  Vitals:   07/23/22 1030  TempSrc:   PainSc: 0-No pain                 Ketty Bitton A.

## 2022-07-23 NOTE — Transfer of Care (Signed)
Immediate Anesthesia Transfer of Care Note  Patient: Denise Harris  Procedure(s) Performed: Procedure(s): XI ROBOTIC ASSISTED TOTAL HYSTERECTOMY WITH BILATERAL SALPINGO OOPHORECTOMY (Bilateral) SENTINEL NODE BIOPSY (N/A)  Patient Location: PACU  Anesthesia Type:General  Level of Consciousness: Alert, Awake, Oriented  Airway & Oxygen Therapy: Patient Spontanous Breathing  Post-op Assessment: Report given to RN  Post vital signs: Reviewed and stable  Last Vitals:  Vitals:   07/23/22 1004 07/23/22 1006  BP: (!) 173/81 (!) 167/76  Pulse: 82   Resp: 15 (!) 25  Temp: (!) 36.4 C   SpO2: 828%     Complications: No apparent anesthesia complications

## 2022-07-23 NOTE — Anesthesia Procedure Notes (Signed)
Procedure Name: Intubation Date/Time: 07/23/2022 8:25 AM  Performed by: Gerald Leitz, CRNAPre-anesthesia Checklist: Patient identified, Patient being monitored, Timeout performed, Emergency Drugs available and Suction available Patient Re-evaluated:Patient Re-evaluated prior to induction Oxygen Delivery Method: Circle system utilized Preoxygenation: Pre-oxygenation with 100% oxygen Induction Type: IV induction Ventilation: Mask ventilation without difficulty Laryngoscope Size: Mac and 3 Grade View: Grade I Tube type: Oral Tube size: 7.5 mm Number of attempts: 1 Airway Equipment and Method: Stylet Placement Confirmation: ETT inserted through vocal cords under direct vision, positive ETCO2 and breath sounds checked- equal and bilateral Secured at: 21 cm Tube secured with: Tape Dental Injury: Teeth and Oropharynx as per pre-operative assessment

## 2022-07-23 NOTE — Discharge Instructions (Signed)
AFTER SURGERY INSTRUCTIONS   Return to work: 4-6 weeks if applicable   Activity: 1. Be up and out of the bed during the day.  Take a nap if needed.  You may walk up steps but be careful and use the hand rail.  Stair climbing will tire you more than you think, you may need to stop part way and rest.    2. No lifting or straining for 6 weeks over 10 pounds. No pushing, pulling, straining for 6 weeks.   3. No driving for AROUND 1 week(s) if you were cleared before surgery.  Do not drive if you are taking narcotic pain medicine and make sure that your reaction time has returned.    4. You can shower as soon as the next day after surgery. Shower daily.  Use your regular soap and water (not directly on the incision) and pat your incision(s) dry afterwards; don't rub.  No tub baths or submerging your body in water until cleared by your surgeon. If you have the soap that was given to you by pre-surgical testing that was used before surgery, you do not need to use it afterwards because this can irritate your incisions.    5. No sexual activity and nothing in the vagina for 8 weeks.   6. You may experience a small amount of clear drainage from your incisions, which is normal.  If the drainage persists, increases, or changes color please call the office.   7. Do not use creams, lotions, or ointments such as neosporin on your incisions after surgery until advised by your surgeon because they can cause removal of the dermabond glue on your incisions.     8. You may experience vaginal spotting after surgery or around the 6-8 week mark from surgery when the stitches at the top of the vagina begin to dissolve.  The spotting is normal but if you experience heavy bleeding, call our office.   9. Take Tylenol or ibuprofen (or continue with voltaren, do not take together) first for pain if you are able to take these medications and only use narcotic pain medication for severe pain not relieved by the Tylenol or  Ibuprofen (or voltaren).  Monitor your Tylenol intake to a max of 4,000 mg in a 24 hour period. You can alternate these medications after surgery.   Diet: 1. Low sodium Heart Healthy Diet is recommended but you are cleared to resume your normal (before surgery) diet after your procedure.   2. It is safe to use a laxative, such as Miralax or Colace, if you have difficulty moving your bowels. You have been prescribed Sennakot-S to take at bedtime every evening after surgery to keep bowel movements regular and to prevent constipation.     Wound Care: 1. Keep clean and dry.  Shower daily.   Reasons to call the Doctor: Fever - Oral temperature greater than 100.4 degrees Fahrenheit Foul-smelling vaginal discharge Difficulty urinating Nausea and vomiting Increased pain at the site of the incision that is unrelieved with pain medicine. Difficulty breathing with or without chest pain New calf pain especially if only on one side Sudden, continuing increased vaginal bleeding with or without clots.   Contacts: For questions or concerns you should contact:   Dr. Jeral Pinch at [phone]   Joylene John, NP at [phone]   After Hours: call [phone] and have the GYN Oncologist paged/contacted (after 5 pm or on the weekends).   Messages sent via mychart are for non-urgent matters and  are not responded to after hours so for urgent needs, please call the after hours number.

## 2022-07-23 NOTE — Op Note (Signed)
OPERATIVE NOTE  Pre-operative Diagnosis: endometrial cancer grade 1  Post-operative Diagnosis: same  Operation: Robotic-assisted laparoscopic total hysterectomy with bilateral salpingoophorectomy, SLN biopsy bilaterally   Surgeon: Jeral Pinch MD  Assistant Surgeon: Lahoma Crocker MD (an MD assistant was necessary for tissue manipulation, management of robotic instrumentation, retraction and positioning due to the complexity of the case and hospital policies).   Anesthesia: GET  Urine Output:  150cc  Operative Findings: On EUA, small mobile uterus. On intra-abdominal entry, normal upper abdominal survey. Normal omentum, small and large bowel. Uterus 8cm, normal in appearance. Normal bilateral adnexa. Mapping successful to right obturator, left external iliac SLN. NO obvious extra-uterine disease.   Estimated Blood Loss:  100 cc      Total IV Fluids: see I&O flowsheet         Specimens: uterus, cervix, bilateral tubes and ovaries, right obturator SLN, left external iliac SLN         Complications:  None apparent; patient tolerated the procedure well.         Disposition: PACU - hemodynamically stable.  Procedure Details  The patient was seen in the Holding Room. The risks, benefits, complications, treatment options, and expected outcomes were discussed with the patient.  The patient concurred with the proposed plan, giving informed consent.  The site of surgery properly noted/marked. The patient was identified as Denise Harris and the procedure verified as a Robotic-assisted hysterectomy with bilateral salpingo oophorectomy with SLN biopsy.   After induction of anesthesia, the patient was draped and prepped in the usual sterile manner. Patient was placed in supine position after anesthesia and draped and prepped in the usual sterile manner as follows: Her arms were tucked to her side with all appropriate precautions.  The shoulders were stabilized with padded shoulder blocks  applied to the acromium processes.  The patient was placed in the semi-lithotomy position in [place].  The perineum and vagina were prepped with CholoraPrep. The patient was draped after the CholoraPrep had been allowed to dry for 3 minutes.  A Time Out was held and the above information confirmed.  The urethra was prepped with Betadine. Foley catheter was placed.  A sterile speculum was placed in the vagina.  The cervix was grasped with a single-tooth tenaculum. '2mg'$  total of ICG was injected into the cervical stroma at 2 and 9 o'clock with 1cc injected at a 1cm and 22m depth (concentration 0.'5mg'$ /ml) in all locations. The cervix was dilated with PKennon Roundsdilators.  The ZUMI uterine manipulator with a medium colpotomizer ring was placed without difficulty.  A pneum occluder balloon was placed over the manipulator.  OG tube placement was confirmed and to suction.   Next, a 10 mm skin incision was made 1 cm below the subcostal margin in the midclavicular line.  The 5 mm Optiview port and scope was used for direct entry.  Opening pressure was under 10 mm CO2.  The abdomen was insufflated and the findings were noted as above.   At this point and all points during the procedure, the patient's intra-abdominal pressure did not exceed 15 mmHg. Next, an 8 mm skin incision was made superior to the umbilicus and a right and left port were placed about 8 cm lateral to the robot port on the right and left side.  A fourth arm was placed on the right.  The 5 mm assist trocar was exchanged for a 10-12 mm port. All ports were placed under direct visualization.  The patient was placed in  steep Trendelenburg.  Bowel was folded away into the upper abdomen.  The robot was docked in the normal manner.  The right and left peritoneum were opened parallel to the IP ligament to open the retroperitoneal spaces bilaterally. The round ligaments were transected. The SLN mapping was performed in bilateral pelvic basins. After identifying  the ureters, the para rectal and paravesical spaces were opened up entirely with careful dissection below the level of the ureters bilaterally and to the depth of the uterine artery origin in order to skeletonize the uterine "web" and ensure visualization of all parametrial channels. The para-aortic basins were carefully exposed and evaluated for isolated para-aortic SLN's. Lymphatic channels were identified travelling to the following visualized sentinel lymph node's: right obturator, left external iliac. These SLN's were separated from their surrounding lymphatic tissue, removed and sent for permanent pathology.  The hysterectomy was started.  The ureter was again noted to be on the medial leaf of the broad ligament.  The peritoneum above the ureter was incised and stretched and the infundibulopelvic ligament was skeletonized, cauterized and cut.  The posterior peritoneum was taken down to the level of the KOH ring.  The anterior peritoneum was also taken down.  The bladder flap was created to the level of the KOH ring.  The uterine artery on the right side was skeletonized, cauterized and cut in the normal manner.  A similar procedure was performed on the left.  The colpotomy was made and the uterus, cervix, bilateral ovaries and tubes were amputated and delivered through the vagina.  Pedicles were inspected and excellent hemostasis was achieved.    The colpotomy at the vaginal cuff was closed with Vicryl on a CT1 needle in a running manner.  Irrigation was used and excellent hemostasis was achieved.  At this point in the procedure was completed.  Robotic instruments were removed under direct visulaization.  The robot was undocked. The fascia at the 10-12 mm port was closed with 0 Vicryl on a UR-5 needle.  The subcuticular tissue was closed with 4-0 Vicryl and the skin was closed with 4-0 Monocryl in a subcuticular manner.  Dermabond was applied.    The vagina was swabbed with some bleeding noted from the  anterior vagina approximately 1 cm proximal to the urethra. Figure of eight stitch using 2-0 Vicryl was placed to achieve hemostasis. Vagina swabbed again with no bleeding noted.   All sponge, lap and needle counts were correct x  3. Foley catheter was removed.  The patient was transferred to the recovery room in stable condition.  Jeral Pinch, MD

## 2022-07-24 ENCOUNTER — Telehealth: Payer: Self-pay

## 2022-07-24 ENCOUNTER — Encounter (HOSPITAL_COMMUNITY): Payer: Self-pay | Admitting: Gynecologic Oncology

## 2022-07-24 NOTE — Telephone Encounter (Signed)
Spoke with daughter Denise Harris  this morning. She states that her mother  is eating, drinking and urinating well. She has not had a BM yet but is passing gas. She is taking senokot as prescribed and encouraged her to drink plenty of water. Told Tabitha to have her mother increase the senokot-s to 2 tabs bid with 8 oz of water.  She denies fever or chills. Incisions are dry and intact. She rates her pain 5/10. Denise Harris is comfortable with this level of pain . She has not used tylenol or tramadol.      Instructed to call office with any fever, chills, purulent drainage, uncontrolled pain or any other questions or concerns. Patient verbalizes understanding.   Pt aware of post op appointments as well as the office number [phone] and after hours number [phone] to call if she has any questions or concerns

## 2022-07-25 ENCOUNTER — Telehealth: Payer: Self-pay

## 2022-07-25 NOTE — Telephone Encounter (Signed)
Spoke with Ms. Hoy Morn, patient's daughte,r this morning. Patient is sleeping. Tabitha reports that pt  is eating, drinking and urinating well. She has had a BM and is passing gas. She is taking senokot as prescribed and encouraged her to drink plenty of water. She denies fever or chills. Incisions are dry and intact. She rates her pain 5/10 but patient reports that it "really isn't pain" but more of discomfort. Her pain is controlled with Tramadol    Instructed to call office with any fever, chills, purulent drainage, uncontrolled pain or any other questions or concerns. Daughter verbalizes understanding.   Pt and daughter aware of post op appointments as well as the office number [phone] and after hours number [phone] to call if she has any questions or concerns

## 2022-07-29 LAB — SURGICAL PATHOLOGY

## 2022-07-30 ENCOUNTER — Encounter: Payer: Self-pay | Admitting: Gynecologic Oncology

## 2022-07-30 ENCOUNTER — Inpatient Hospital Stay: Payer: Medicare Other | Attending: Gynecologic Oncology | Admitting: Gynecologic Oncology

## 2022-07-30 DIAGNOSIS — Z90722 Acquired absence of ovaries, bilateral: Secondary | ICD-10-CM

## 2022-07-30 DIAGNOSIS — Z90710 Acquired absence of both cervix and uterus: Secondary | ICD-10-CM

## 2022-07-30 DIAGNOSIS — Z7189 Other specified counseling: Secondary | ICD-10-CM

## 2022-07-30 DIAGNOSIS — C541 Malignant neoplasm of endometrium: Secondary | ICD-10-CM

## 2022-07-30 NOTE — Progress Notes (Signed)
Gynecologic Oncology Telehealth Note: Gyn-Onc  I connected with Denise Harris on 07/30/22 at  4:20 PM EDT by telephone and verified that I am speaking with the correct person using two identifiers.  I discussed the limitations, risks, security and privacy concerns of performing an evaluation and management service by telemedicine and the availability of in-person appointments. I also discussed with the patient that there may be a patient responsible charge related to this service. The patient expressed understanding and agreed to proceed.  Other persons participating in the visit and their role in the encounter: Tabitha, patient's daughter.  Patient's location: Home Provider's location: [facility]  Reason for Visit: Follow-up after surgery, treatment planning  Treatment History: Oncology History Overview Note  MMR IHC   Endometrial cancer ([place])  06/27/2022 Initial Diagnosis   Endometrial cancer ([place])   07/23/2022 Definitive Surgery   Robotic-assisted laparoscopic total hysterectomy with bilateral salpingoophorectomy, SLN biopsy bilaterally   Findings: On EUA, small mobile uterus. On intra-abdominal entry, normal upper abdominal survey. Normal omentum, small and large bowel. Uterus 8cm, normal in appearance. Normal bilateral adnexa. Mapping successful to right obturator, left external iliac SLN. NO obvious extra-uterine disease.    07/23/2022 Pathology Results   A. LYMPH NODE, SENTINEL, RIGHT OBTURATOR, BIOPSY:  - Lymph node, negative for carcinoma (0/1), see comment   B. LYMPH NODE, SENTINEL, LEFT EXTERNAL ILIAC, BIOPSY:  - Lymph node, negative for carcinoma (0/1), see comment   C. 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  Immunostain for cytokeratin AE1/AE3 shows focal nonspecific  staining.  Evidence of metastatic carcinoma is not seen.  Dr. Saralyn Pilar  reviewed these parts from the case and concurs with the diagnosis.   C.  Immunostain for p53 shows a normal, wild-type expression pattern in  the tumor cells.    ONCOLOGY TABLE:   UTERUS, CARCINOMA OR CARCINOSARCOMA: Resection   Procedure: Total hysterectomy and bilateral salpingo-oophorectomy  Histologic Type: Endometrioid carcinoma  Histologic Grade: FIGO grade 1  Myometrial Invasion:       Depth of Myometrial Invasion (mm): 15 mm       Myometrial Thickness (mm): 20 mm       Percentage of Myometrial Invasion: 75%  Uterine Serosa Involvement: Not identified  Cervical stromal Involvement: Not identified  Extent of involvement of other tissue/organs: Not identified  Peritoneal/Ascitic Fluid: Not applicable  Lymphovascular Invasion: Not identified  Regional Lymph Nodes:       Pelvic Lymph Nodes Examined:                                   2 Sentinel                                   0 Non-sentinel                                   2 Total  Pelvic Lymph Nodes with Metastasis: 0                           Macrometastasis: (>2.0 mm): 0                           Micrometastasis: (>0.2 mm and < 2.0 mm): 0                           Isolated Tumor Cells (<0.2 mm): 0                           Laterality of Lymph Node with Tumor: Not  applicable                           Extracapsular Extension: Not applicable       Para-aortic Lymph Nodes Examined:                                    0 Sentinel                                    0 non-sentinel                                    0 total       Para-aortic Lymph Nodes with Metastasis: 0  Distant Metastasis:       Distant Site(s) Involved: Not applicable  Pathologic Stage Classification (pTNM, AJCC  8th Edition): pT1b, pN0  Ancillary Studies: MMR / MSI testing will be ordered  Representative Tumor Block: C5  Comment(s): Pancytokeratin was performed on the lymph nodes and is  negative.      Interval History: Doing well. Denies pain. Appetite good, no nausea. Bowel function normal. Denies urinary symptoms. Denies vaginal bleeding, discharge.   Past Medical/Surgical History: Past Medical History:  Diagnosis Date   Anxiety    Arthritis    Cancer ([place])    Depression    History of short term memory loss    Due to stroke   HLD (hyperlipidemia)    Hypertension    Left-sided weakness    post stroke   Stroke ([place])    2008, HTN    Past Surgical History:  Procedure Laterality Date   NO PAST SURGERIES     ROBOTIC ASSISTED TOTAL HYSTERECTOMY WITH BILATERAL SALPINGO OOPHERECTOMY Bilateral 07/23/2022   Procedure: XI ROBOTIC ASSISTED TOTAL HYSTERECTOMY WITH BILATERAL SALPINGO OOPHORECTOMY;  Surgeon: Lafonda Mosses, MD;  Location: [facility];  Service: Gynecology;  Laterality: Bilateral;   SENTINEL NODE BIOPSY N/A 07/23/2022   Procedure: SENTINEL NODE BIOPSY;  Surgeon: Lafonda Mosses, MD;  Location: [facility];  Service: Gynecology;  Laterality: N/A;    Family History  Problem Relation Age of Onset   Breast cancer Mother    Cancer Sister        stomach?   Cancer Maternal Aunt     Social History   Socioeconomic History   Marital status: Married    Spouse name: Not on file   Number of children: Not on file  Years of education: Not on file   Highest education level: Not on file  Occupational History   Not on file  Tobacco Use   Smoking status: Never   Smokeless tobacco: Never  Vaping Use   Vaping Use: Never used  Substance and Sexual Activity   Alcohol use: Never   Drug use: Never   Sexual activity: Not Currently  Other Topics Concern   Not on file  Social History Narrative   Not on file   Social Determinants of Health   Financial Resource Strain: Not on  file  Food Insecurity: Not on file  Transportation Needs: Not on file  Physical Activity: Not on file  Stress: Not on file  Social Connections: Not on file    Current Medications:  Current Outpatient Medications:    atorvastatin (LIPITOR) 20 MG tablet, Take 20 mg by mouth at bedtime., Disp: , Rfl:    baclofen (LIORESAL) 10 MG tablet, Take 10 mg by mouth 3 (three) times daily., Disp: , Rfl:    citalopram (CELEXA) 10 MG tablet, Take 10 mg by mouth daily., Disp: , Rfl:    diclofenac (VOLTAREN) 75 MG EC tablet, Take 75 mg by mouth at bedtime., Disp: , Rfl:    fluticasone (FLONASE) 50 MCG/ACT nasal spray, Place 1 spray into both nostrils daily as needed for allergies., Disp: , Rfl:    lisinopril-hydrochlorothiazide (ZESTORETIC) 20-12.5 MG tablet, Take 1 tablet by mouth daily., Disp: , Rfl:    senna-docusate (SENOKOT-S) 8.6-50 MG tablet, Take 2 tablets by mouth at bedtime. For AFTER surgery, do not take if having diarrhea, Disp: 30 tablet, Rfl: 0   solifenacin (VESICARE) 5 MG tablet, Take 5 mg by mouth at bedtime., Disp: , Rfl:    traMADol (ULTRAM) 50 MG tablet, Take 1 tablet (50 mg total) by mouth every 6 (six) hours as needed for severe pain. For AFTER surgery only, do not take and drive, Disp: 10 tablet, Rfl: 0  Review of Symptoms: Pertinent positives as per HPI.  Physical Exam: Deferred given limitations of phone visit.  Laboratory & Radiologic Studies: A. LYMPH NODE, SENTINEL, RIGHT OBTURATOR, BIOPSY:  - Lymph node, negative for carcinoma (0/1), see comment   B. LYMPH NODE, SENTINEL, LEFT EXTERNAL ILIAC, BIOPSY:  - Lymph node, negative for carcinoma (0/1), see comment   C. 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 woman with Stage IB grade 1 endometrioid endometrial who presents for phone follow-up.  Is doing very well from a postoperative standpoint.  Discussed continued expectations and postoperative restrictions.  Reviewed pathology with her in detail.  She has outer have invasion but does not meet other high-intermediate risk criteria.  There is a comment that there is lower uterine segment involvement but no definite cervical stroma involvement.  We will plan to review this at our next tumor board to assure the patient would not benefit from adjuvant therapy.  I discussed the assessment and treatment plan with the patient. The patient was provided with an opportunity to ask questions and all were answered. The patient agreed with the plan and demonstrated an understanding of  the instructions.   The patient was advised to call back or see an in-person evaluation if the symptoms worsen or if the condition fails to improve as anticipated.   12 minutes of total time was spent for this patient encounter, including preparation, phone counseling with the patient and coordination of care, and documentation of the encounter.   Jeral Pinch, MD  Division of Gynecologic Oncology  Department of Obstetrics and Gynecology  [facility]

## 2022-08-12 ENCOUNTER — Other Ambulatory Visit: Payer: Self-pay | Admitting: *Deleted

## 2022-08-12 ENCOUNTER — Encounter: Payer: Self-pay | Admitting: Gynecologic Oncology

## 2022-08-12 NOTE — Progress Notes (Unsigned)
Spoke with Suanne Marker in [facility] about obtaining additional details on surgical pathology pertaining to whether there was cervical involvement.

## 2022-08-12 NOTE — Progress Notes (Signed)
The proposed treatment discussed in conference is for discussion purpose only and is not a binding recommendation.  The patients have not been physically examined, or presented with their treatment options.  Therefore, final treatment plans cannot be decided.  

## 2022-08-20 ENCOUNTER — Inpatient Hospital Stay: Payer: Medicare Other | Attending: Gynecologic Oncology | Admitting: Gynecologic Oncology

## 2022-08-20 ENCOUNTER — Other Ambulatory Visit: Payer: Self-pay

## 2022-08-20 ENCOUNTER — Encounter: Payer: Self-pay | Admitting: Gynecologic Oncology

## 2022-08-20 VITALS — BP 160/90 | HR 69 | Temp 98.4°F | Resp 20 | Wt 172.0 lb

## 2022-08-20 DIAGNOSIS — C541 Malignant neoplasm of endometrium: Secondary | ICD-10-CM

## 2022-08-20 DIAGNOSIS — Z7189 Other specified counseling: Secondary | ICD-10-CM

## 2022-08-20 NOTE — Patient Instructions (Addendum)
It was good to see you today.  You are healing well from surgery.  Please remember, no heavy lifting for at least 6 weeks after surgery and nothing in the vagina for 8-10.  I am going to talk to our radiation doctor again.  I will call you after I have a discussion with him.  I will see you for follow-up in 6 months.  Please call sometime in December or January to schedule a visit to see me in April.  As always, if you develop any new and concerning symptoms before your next visit, please call to see me sooner.  Symptoms that should prompt a phone call to me include vaginal bleeding, pelvic pain, change to bowel function, or unintentional weight loss.

## 2022-08-20 NOTE — Progress Notes (Signed)
Gynecologic Oncology Return Clinic Visit  08/20/22  Reason for Visit: follow-up after surgery, treatment planning  Treatment History: Oncology History Overview Note  MMR IHC   Endometrial cancer ([place])  06/27/2022 Initial Diagnosis   Endometrial cancer ([place])   07/23/2022 Definitive Surgery   Robotic-assisted laparoscopic total hysterectomy with bilateral salpingoophorectomy, SLN biopsy bilaterally   Findings: On EUA, small mobile uterus. On intra-abdominal entry, normal upper abdominal survey. Normal omentum, small and large bowel. Uterus 8cm, normal in appearance. Normal bilateral adnexa. Mapping successful to right obturator, left external iliac SLN. NO obvious extra-uterine disease.    07/23/2022 Pathology Results   A. LYMPH NODE, SENTINEL, RIGHT OBTURATOR, BIOPSY:  - Lymph node, negative for carcinoma (0/1), see comment   B. LYMPH NODE, SENTINEL, LEFT EXTERNAL ILIAC, BIOPSY:  - Lymph node, negative for carcinoma (0/1), see comment   C. 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  Immunostain for cytokeratin AE1/AE3 shows focal nonspecific  staining.  Evidence of metastatic carcinoma is not seen.  Dr. Saralyn Pilar  reviewed these parts from the case and concurs with the diagnosis.   C.  Immunostain for p53 shows a normal, wild-type expression pattern in  the tumor cells.    ONCOLOGY TABLE:   UTERUS, CARCINOMA OR CARCINOSARCOMA: Resection   Procedure: Total hysterectomy and bilateral salpingo-oophorectomy  Histologic Type: Endometrioid carcinoma  Histologic Grade: FIGO grade 1  Myometrial Invasion:       Depth of Myometrial Invasion  (mm): 15 mm       Myometrial Thickness (mm): 20 mm       Percentage of Myometrial Invasion: 75%  Uterine Serosa Involvement: Not identified  Cervical stromal Involvement: Not identified  Extent of involvement of other tissue/organs: Not identified  Peritoneal/Ascitic Fluid: Not applicable  Lymphovascular Invasion: Not identified  Regional Lymph Nodes:       Pelvic Lymph Nodes Examined:                                   2 Sentinel                                   0 Non-sentinel                                   2 Total       Pelvic Lymph Nodes with Metastasis: 0                           Macrometastasis: (>2.0 mm): 0                           Micrometastasis: (>0.2 mm and < 2.0 mm): 0                           Isolated Tumor Cells (<0.2 mm):  0                           Laterality of Lymph Node with Tumor: Not  applicable                           Extracapsular Extension: Not applicable       Para-aortic Lymph Nodes Examined:                                    0 Sentinel                                    0 non-sentinel                                    0 total       Para-aortic Lymph Nodes with Metastasis: 0  Distant Metastasis:       Distant Site(s) Involved: Not applicable  Pathologic Stage Classification (pTNM, AJCC 8th Edition): pT1b, pN0  Ancillary Studies: MMR / MSI testing will be ordered  Representative Tumor Block: C5  Comment(s): Pancytokeratin was performed on the lymph nodes and is  negative.      Interval History: Doing well.  Denies any abdominal or pelvic pain.  Reports baseline bowel and bladder function.  Denies any vaginal bleeding or discharge.  Has been taking blood pressure medication at night.  Has a way to check her blood pressure but has not been doing this.  Sees her primary care provider next month.  Denies any headache or vision changes.  Past Medical/Surgical History: Past Medical History:  Diagnosis Date   Anxiety    Arthritis    Cancer ([place])     Depression    History of short term memory loss    Due to stroke   HLD (hyperlipidemia)    Hypertension    Left-sided weakness    post stroke   Stroke ([place])    2008, HTN    Past Surgical History:  Procedure Laterality Date   NO PAST SURGERIES     ROBOTIC ASSISTED TOTAL HYSTERECTOMY WITH BILATERAL SALPINGO OOPHERECTOMY Bilateral 07/23/2022   Procedure: XI ROBOTIC ASSISTED TOTAL HYSTERECTOMY WITH BILATERAL SALPINGO OOPHORECTOMY;  Surgeon: Lafonda Mosses, MD;  Location: [facility];  Service: Gynecology;  Laterality: Bilateral;   SENTINEL NODE BIOPSY N/A 07/23/2022   Procedure: SENTINEL NODE BIOPSY;  Surgeon: Lafonda Mosses, MD;  Location: [facility];  Service: Gynecology;  Laterality: N/A;    Family History  Problem Relation Age of Onset   Breast cancer Mother    Cancer Sister        stomach?   Cancer Maternal Aunt     Social History   Socioeconomic History   Marital status: Married    Spouse name: Not on file   Number of children: Not on file   Years of education: Not on file   Highest education level: Not on file  Occupational History   Not on file  Tobacco Use   Smoking status: Never   Smokeless tobacco: Never  Vaping Use   Vaping Use: Never used  Substance and Sexual Activity   Alcohol use: Never   Drug use:  Never   Sexual activity: Not Currently  Other Topics Concern   Not on file  Social History Narrative   Not on file   Social Determinants of Health   Financial Resource Strain: Not on file  Food Insecurity: Not on file  Transportation Needs: Not on file  Physical Activity: Not on file  Stress: Not on file  Social Connections: Not on file    Current Medications:  Current Outpatient Medications:    atorvastatin (LIPITOR) 20 MG tablet, Take 20 mg by mouth at bedtime., Disp: , Rfl:    baclofen (LIORESAL) 10 MG tablet, Take 10 mg by mouth 3 (three) times daily., Disp: , Rfl:    citalopram (CELEXA) 10 MG tablet, Take 10 mg by mouth daily., Disp: ,  Rfl:    diclofenac (VOLTAREN) 75 MG EC tablet, Take 75 mg by mouth at bedtime., Disp: , Rfl:    fluticasone (FLONASE) 50 MCG/ACT nasal spray, Place 1 spray into both nostrils daily as needed for allergies., Disp: , Rfl:    lisinopril-hydrochlorothiazide (ZESTORETIC) 20-12.5 MG tablet, Take 1 tablet by mouth daily., Disp: , Rfl:    solifenacin (VESICARE) 5 MG tablet, Take 5 mg by mouth at bedtime., Disp: , Rfl:   Review of Systems: Denies appetite changes, fevers, chills, fatigue, unexplained weight changes. Denies hearing loss, neck lumps or masses, mouth sores, ringing in ears or voice changes. Denies cough or wheezing.  Denies shortness of breath. Denies chest pain or palpitations. Denies leg swelling. Denies abdominal distention, pain, blood in stools, constipation, diarrhea, nausea, vomiting, or early satiety. Denies pain with intercourse, dysuria, frequency, hematuria or incontinence. Denies hot flashes, pelvic pain, vaginal bleeding or vaginal discharge.   Denies joint pain, back pain or muscle pain/cramps. Denies itching, rash, or wounds. Denies dizziness, headaches, numbness or seizures. Denies swollen lymph nodes or glands, denies easy bruising or bleeding. Denies anxiety, depression, confusion, or decreased concentration.  Physical Exam: BP (!) 160/90 (BP Location: Right Arm, Patient Position: Sitting) Comment: taken manual, MD notified  Pulse 69   Temp 98.4 F (36.9 C) (Oral)   Resp 20   Wt 172 lb (78 kg)   SpO2 99%   BMI 29.52 kg/m  General: Alert, oriented, no acute distress. HEENT: Normocephalic, atraumatic, sclera anicteric. Chest: Unlabored breathing on room air. Abdomen: soft, nontender.  Normoactive bowel sounds.  No masses or hepatosplenomegaly appreciated.  Well-healed incisions. Extremities: Limited range of motion of the left leg.  Warm, well perfused.  No edema bilaterally. GU: Normal appearing external genitalia without erythema, excoriation, or lesions.   Speculum exam reveals no blood or discharge, cuff intact visually with suture visible.  Bimanual exam reveals intact, no fluctuance or tenderness with palpation.    Laboratory & Radiologic Studies: None new  Assessment & Plan: ASA FATH is a 61 y.o. woman with Stage IB grade 1 endometrioid endometrial who presents for postoperative follow-up. MMR intact, MS-stable. P53 negative.   Patient is doing well from a postoperative standpoint.  Discussed continued expectations and postoperative restrictions.   Reviewed pathology with her in detail again.  She was given a copy of her pathology report.  She has deep mild invasion, no lymphovascular invasion.  We reviewed her case at tumor board.  While there is lower uterine segment involvement, there is no definitive cervical stromal involvement.  Lymph node assessment was negative.  While she does not meet high-intermediate risk criteria, given her deep myometrial invasion, I will reach out to Dr. Sondra Come to discuss the  benefits of vaginal brachytherapy versus observation.  I spoke with him after the patient left clinic today and he agrees that given the deep myometrial invasion as well as size of the tumor and location in the lower uterine segment, she would benefit from at least consideration of vaginal brachytherapy.    Patient's blood pressure was quite elevated when she presented.  When this was checked manually at the end of the visit, her blood pressure was 160/90.  She denies any symptoms.  She is able to check her blood pressure at home.  I have encouraged her to do this.  I have also suggested that she reach out to her primary care provider given her elevated blood pressure.  22 minutes of total time was spent for this patient encounter, including preparation, face-to-face counseling with the patient and coordination of care, and documentation of the encounter.  Jeral Pinch, MD  Division of Gynecologic Oncology  Department of Obstetrics  and Gynecology  [facility]

## 2022-08-30 ENCOUNTER — Telehealth: Payer: Self-pay

## 2022-08-30 NOTE — Telephone Encounter (Signed)
Daughter called with patient on speaker phone. Patient "does not want radiation treatment."  I called daughter back and spoke with daughter to review Dr. Charisse March office note. Daughter had not been in the room for the discussion between Dr. Berline Lopes and patient regarding possible radiation therapy. I advised that Dr. Berline Lopes had spoken with Dr. Sondra Come after the office visit to discuss the benefits of vaginal brachytherapy versus observation. Relayed that Dr. Sondra Come agrees that given the deep myometrial invasion as well as size of the tumor and location in the lower uterine segment, pt would benefit from at least consideration of vaginal brachytherapy.  Explained that it might be helpful for pt to meet with Dr. Sondra Come to discuss prior to making final decision. Daughter will speak with pt and then will call clinic with what patient decides.  Routed to [facility] and [place] NP.

## 2022-09-05 ENCOUNTER — Ambulatory Visit: Payer: Medicare Other | Admitting: Radiation Oncology

## 2022-09-05 ENCOUNTER — Ambulatory Visit: Payer: Medicare Other

## 2022-11-05 NOTE — Telephone Encounter (Signed)
Could this patient be scheduled for a 3 month follow up with me from her last visit? Thank you

## 2022-11-06 ENCOUNTER — Telehealth: Payer: Self-pay | Admitting: *Deleted

## 2022-11-06 NOTE — Telephone Encounter (Signed)
Per Dr Berline Lopes patient scheduled for a follow up appt on 2/22. Patient's daughter requested a Thursday appt

## 2022-11-06 NOTE — Telephone Encounter (Signed)
Done and daughter aware (2/22 per daughter request, requested a Thursday appt)

## 2023-01-09 ENCOUNTER — Inpatient Hospital Stay: Payer: Medicare Other | Attending: Gynecologic Oncology | Admitting: Gynecologic Oncology

## 2023-01-09 DIAGNOSIS — C541 Malignant neoplasm of endometrium: Secondary | ICD-10-CM

## 2023-01-09 NOTE — Progress Notes (Unsigned)
Patient was no show for this scheduled appointment.

## 2023-01-09 NOTE — Patient Instructions (Signed)
It was good to see you today.  I do not see or feel any evidence of cancer recurrence on your exam.  I will see you for follow-up in 3 months.  As always, if you develop any new and concerning symptoms before your next visit, please call to see me sooner.

## 2023-12-17 ENCOUNTER — Telehealth: Payer: Self-pay | Admitting: Family Medicine

## 2023-12-17 NOTE — Telephone Encounter (Signed)
Copied from CRM [id number]. Topic: Clinical - Prescription Issue >> Dec 16, 2023  5:02 PM Shelah Lewandowsky wrote: Reason for CRM: Lanora Manis with [facility] Delivered, need corrections done and sent back showing incontinence, initialing and dating, fax number [phone], phone [phone]

## 2023-12-17 NOTE — Telephone Encounter (Signed)
Have requested the forms to be re-faxed. Will place in providers box once received.

## 2023-12-17 NOTE — Telephone Encounter (Signed)
This isn't my patient. I use to see her in [place]. They need to fax this to the [facility] family medicine office.

## 2024-01-26 ENCOUNTER — Encounter: Payer: Self-pay | Admitting: Obstetrics & Gynecology

## 2024-04-08 ENCOUNTER — Other Ambulatory Visit (HOSPITAL_COMMUNITY): Payer: Self-pay | Admitting: Internal Medicine

## 2024-04-08 DIAGNOSIS — G459 Transient cerebral ischemic attack, unspecified: Secondary | ICD-10-CM

## 2024-04-14 ENCOUNTER — Ambulatory Visit (HOSPITAL_COMMUNITY)

## 2024-06-02 ENCOUNTER — Ambulatory Visit (HOSPITAL_COMMUNITY)
Admission: RE | Admit: 2024-06-02 | Discharge: 2024-06-02 | Disposition: A | Discharge status: Home / Self Care | Source: Ambulatory Visit | Attending: Internal Medicine | Admitting: Internal Medicine

## 2024-06-02 DIAGNOSIS — G459 Transient cerebral ischemic attack, unspecified: Secondary | ICD-10-CM | POA: Diagnosis present
